# Patient Record
Sex: Female | Born: 1935
Health system: Southern US, Community
[De-identification: ages and names within clinical notes are randomized; demographics above are authoritative.]

## PROBLEM LIST (undated history)

## (undated) DIAGNOSIS — G8929 Other chronic pain: Secondary | ICD-10-CM

## (undated) DIAGNOSIS — M1990 Unspecified osteoarthritis, unspecified site: Secondary | ICD-10-CM

## (undated) DIAGNOSIS — E079 Disorder of thyroid, unspecified: Secondary | ICD-10-CM

## (undated) HISTORY — PX: CHOLECYSTECTOMY: SHX55

## (undated) HISTORY — PX: CERVICAL FUSION: SHX112

## (undated) HISTORY — PX: ABDOMINAL HYSTERECTOMY: SHX81

---

## 1997-08-07 ENCOUNTER — Encounter: Admission: RE | Admit: 1997-08-07 | Discharge: 1997-11-05 | Payer: Self-pay | Admitting: Family Medicine

## 1998-01-23 ENCOUNTER — Ambulatory Visit (HOSPITAL_COMMUNITY): Admission: RE | Admit: 1998-01-23 | Discharge: 1998-01-24 | Payer: Self-pay | Admitting: Surgery

## 1998-01-23 ENCOUNTER — Encounter: Payer: Self-pay | Admitting: Surgery

## 1998-10-20 ENCOUNTER — Other Ambulatory Visit: Admission: RE | Admit: 1998-10-20 | Discharge: 1998-10-20 | Payer: Self-pay | Admitting: Plastic Surgery

## 1998-10-20 ENCOUNTER — Encounter (INDEPENDENT_AMBULATORY_CARE_PROVIDER_SITE_OTHER): Payer: Self-pay

## 1998-10-31 ENCOUNTER — Ambulatory Visit (HOSPITAL_COMMUNITY): Admission: RE | Admit: 1998-10-31 | Discharge: 1998-11-02 | Payer: Self-pay | Admitting: Plastic Surgery

## 1998-10-31 ENCOUNTER — Encounter: Payer: Self-pay | Admitting: Plastic Surgery

## 2000-01-26 ENCOUNTER — Encounter: Admission: RE | Admit: 2000-01-26 | Discharge: 2000-04-25 | Payer: Self-pay | Admitting: Anesthesiology

## 2000-01-27 ENCOUNTER — Encounter: Admission: RE | Admit: 2000-01-27 | Discharge: 2000-04-26 | Payer: Self-pay | Admitting: Anesthesiology

## 2000-04-18 ENCOUNTER — Ambulatory Visit (HOSPITAL_BASED_OUTPATIENT_CLINIC_OR_DEPARTMENT_OTHER): Admission: RE | Admit: 2000-04-18 | Discharge: 2000-04-18 | Payer: Self-pay | Admitting: Orthopedic Surgery

## 2000-05-05 ENCOUNTER — Encounter: Admission: RE | Admit: 2000-05-05 | Discharge: 2000-08-03 | Payer: Self-pay | Admitting: Anesthesiology

## 2000-05-23 ENCOUNTER — Encounter: Payer: Self-pay | Admitting: Anesthesiology

## 2000-08-15 ENCOUNTER — Encounter: Payer: Self-pay | Admitting: Family Medicine

## 2000-08-15 ENCOUNTER — Encounter: Admission: RE | Admit: 2000-08-15 | Discharge: 2000-08-15 | Payer: Self-pay | Admitting: Family Medicine

## 2000-08-31 ENCOUNTER — Encounter: Admission: RE | Admit: 2000-08-31 | Discharge: 2000-09-21 | Payer: Self-pay | Admitting: Anesthesiology

## 2003-02-25 ENCOUNTER — Emergency Department (HOSPITAL_COMMUNITY): Admission: EM | Admit: 2003-02-25 | Discharge: 2003-02-25 | Payer: Self-pay | Admitting: Emergency Medicine

## 2003-12-23 ENCOUNTER — Ambulatory Visit (HOSPITAL_COMMUNITY): Admission: RE | Admit: 2003-12-23 | Discharge: 2003-12-23 | Payer: Self-pay | Admitting: Family Medicine

## 2004-01-03 ENCOUNTER — Ambulatory Visit (HOSPITAL_COMMUNITY): Admission: RE | Admit: 2004-01-03 | Discharge: 2004-01-03 | Payer: Self-pay | Admitting: Gastroenterology

## 2005-04-07 ENCOUNTER — Ambulatory Visit (HOSPITAL_COMMUNITY): Admission: RE | Admit: 2005-04-07 | Discharge: 2005-04-07 | Payer: Self-pay | Admitting: Family Medicine

## 2009-11-04 ENCOUNTER — Encounter: Admission: RE | Admit: 2009-11-04 | Discharge: 2009-12-17 | Payer: Self-pay | Admitting: Orthopedic Surgery

## 2010-07-10 NOTE — H&P (Signed)
[facility]  Patient:    Lauren Herrera, Lauren Herrera                        MRN: [id number] Adm. Date:  [id number] Attending:  Thyra Breed CC:         Dario Guardian, M.D.   History and Physical  Beva comes in for followup evaluation of her chronic low back pain and neck discomfort.  Since her last evaluation, her lower back is bothering her minimally, and she has some neck discomfort localized to the neck.  She feels much better on the Duragesic patches than she did on the MS Contin, but has noted that her pain level is up to about 7/10.  Her arms are not bothering her.  She has minimal additional complaints from her last visit.  She does note that lifting and sweeping will increase her discomfort.  She continues on her Synthroid, Evista, Diozide, Verapamil, Elavil, and Phentanyl.  PHYSICAL EXAMINATION:  VITAL SIGNS:  Blood pressure 142/75, heart rate 85, respiratory rate 15, O2 saturation 99%, pain level 7/10.  EXTREMITIES:  Deep tendon reflexes were symmetric in the upper and lower extremities with motor intact.  Her gait is intact.  IMPRESSION: 1. Neck pain on the basis of cervical spondylosis, improved. 2. Low back pain on the basis of lumbar spondylosis with arachnoiditis,    stable. 3. Other medical problems per Dr. Merri Brunette.  DISPOSITION: 1. Duragesic 75 mcg one applied every 2 days #15. 2. Continue on Elavil. 3. Follow up with me in 8 weeks. DD:  09/01/00 TD:  09/01/00 Job: [id number]

## 2010-07-10 NOTE — Op Note (Signed)
NAME:  BEONKA, AMESQUITA                 ACCOUNT NO.:  [id number]   MEDICAL RECORD NO.:  [id number]          PATIENT TYPE:  AMB   LOCATION:  ENDO                         FACILITY:  [facility]   PHYSICIAN:  Graylin Shiver, M.D.   DATE OF BIRTH:  Sep 07, 1935   DATE OF PROCEDURE:  01/03/2004  DATE OF DISCHARGE:                                 OPERATIVE REPORT   PROCEDURE:  Colonoscopy.   INDICATIONS:  Family history of colon cancer.   Informed consent was obtained after explanation of the risks of bleeding,  infection, and perforation.   PREMEDICATION:  Fentanyl 100 mcg IV, Versed 7.5 mg IV.   PROCEDURE:  With the patient in the left lateral decubitus position, a  rectal exam was performed, no masses were felt.  The Olympus colonoscope was  inserted into the rectum and advanced around an extremely tortuous colon to  the cecum.  The patient had to be placed on her back and even on her right  side to achieve cecal intubation.  The cecum and ascending colon looked  normal.  The transverse colon looked normal.  The descending colon and  sigmoid revealed diverticulosis.  These were quite extensive in the sigmoid  region.  The rectum looked normal.  She tolerated the procedure well without  complications.   IMPRESSION:  Diverticulosis.       SFG/MEDQ  D:  01/03/2004  T:  01/04/2004  Job:  [id number]   cc:   Holley Bouche, M.D.  [address]  [place], [place] [address]  Fax: [phone]

## 2010-12-31 ENCOUNTER — Other Ambulatory Visit: Payer: Self-pay | Admitting: Rheumatology

## 2010-12-31 ENCOUNTER — Ambulatory Visit
Admission: RE | Admit: 2010-12-31 | Discharge: 2010-12-31 | Disposition: A | Discharge status: Home / Self Care | Payer: Medicare Other | Source: Ambulatory Visit | Attending: Rheumatology | Admitting: Rheumatology

## 2010-12-31 DIAGNOSIS — M069 Rheumatoid arthritis, unspecified: Secondary | ICD-10-CM

## 2010-12-31 DIAGNOSIS — M542 Cervicalgia: Secondary | ICD-10-CM

## 2011-04-13 ENCOUNTER — Emergency Department (INDEPENDENT_AMBULATORY_CARE_PROVIDER_SITE_OTHER): Payer: Medicare Other

## 2011-04-13 ENCOUNTER — Encounter (HOSPITAL_BASED_OUTPATIENT_CLINIC_OR_DEPARTMENT_OTHER): Payer: Self-pay | Admitting: *Deleted

## 2011-04-13 ENCOUNTER — Other Ambulatory Visit: Payer: Self-pay

## 2011-04-13 ENCOUNTER — Emergency Department (HOSPITAL_BASED_OUTPATIENT_CLINIC_OR_DEPARTMENT_OTHER)
Admission: EM | Admit: 2011-04-13 | Discharge: 2011-04-13 | Disposition: A | Discharge status: Home / Self Care | Payer: Medicare Other | Attending: Emergency Medicine | Admitting: Emergency Medicine

## 2011-04-13 DIAGNOSIS — M79609 Pain in unspecified limb: Secondary | ICD-10-CM

## 2011-04-13 DIAGNOSIS — M11249 Other chondrocalcinosis, unspecified hand: Secondary | ICD-10-CM

## 2011-04-13 DIAGNOSIS — Z8739 Personal history of other diseases of the musculoskeletal system and connective tissue: Secondary | ICD-10-CM | POA: Insufficient documentation

## 2011-04-13 DIAGNOSIS — E079 Disorder of thyroid, unspecified: Secondary | ICD-10-CM | POA: Insufficient documentation

## 2011-04-13 DIAGNOSIS — M25529 Pain in unspecified elbow: Secondary | ICD-10-CM

## 2011-04-13 DIAGNOSIS — G8929 Other chronic pain: Secondary | ICD-10-CM | POA: Insufficient documentation

## 2011-04-13 HISTORY — DX: Other chronic pain: G89.29

## 2011-04-13 HISTORY — DX: Unspecified osteoarthritis, unspecified site: M19.90

## 2011-04-13 HISTORY — DX: Disorder of thyroid, unspecified: E07.9

## 2011-04-13 LAB — CBC
MCH: 29.4 pg (ref 26.0–34.0)
MCHC: 33.2 g/dL (ref 30.0–36.0)
Platelets: 192 10*3/uL (ref 150–400)

## 2011-04-13 LAB — CARDIAC PANEL(CRET KIN+CKTOT+MB+TROPI): Troponin I: <0.3 ng/mL (ref <0.30)

## 2011-04-13 LAB — DIFFERENTIAL
Basophils Relative: 0 % (ref 0–1)
Eosinophils Absolute: 0.2 10*3/uL (ref 0.0–0.7)
Eosinophils Relative: 3 % (ref 0–5)
Monocytes Relative: 10 % (ref 3–12)
Neutrophils Relative %: 72 % (ref 43–77)

## 2011-04-13 LAB — BASIC METABOLIC PANEL
BUN: 9 mg/dL (ref 6–23)
Calcium: 9.7 mg/dL (ref 8.4–10.5)
GFR calc Af Amer: >90 mL/min (ref >90)
GFR calc non Af Amer: 84 mL/min — ABNORMAL LOW (ref >90)
Potassium: 3.5 mEq/L (ref 3.5–5.1)
Sodium: 139 mEq/L (ref 135–145)

## 2011-04-13 MED ORDER — IBUPROFEN 600 MG PO TABS: 600.0000 mg | ORAL_TABLET | Freq: Four times a day (QID) | ORAL | Status: AC | PRN

## 2011-04-13 MED ORDER — KETOROLAC TROMETHAMINE 60 MG/2ML IM SOLN
60.0000 mg | Freq: Once | INTRAMUSCULAR | Status: AC
  Administered 2011-04-13: 60 mg via INTRAMUSCULAR
  Filled 2011-04-13: qty 2

## 2011-04-13 MED ORDER — OXYCODONE-ACETAMINOPHEN 5-325 MG PO TABS
1.0000 | ORAL_TABLET | Freq: Once | ORAL | Status: AC
  Administered 2011-04-13: 1 via ORAL
  Filled 2011-04-13: qty 1

## 2011-04-13 NOTE — ED Provider Notes (Signed)
History     CSN: [id number]  Arrival date & time 04/13/11  4098   First MD Initiated Contact with Patient 04/13/11 [phone]      Chief Complaint  Patient presents with  . Arm Pain    (Consider location/radiation/quality/duration/timing/severity/associated sxs/prior treatment) Patient is a 76 y.o. female presenting with arm pain. The history is provided by the patient. No language interpreter was used.  Arm Pain This is a new problem. The current episode started 3 to 5 hours ago. The problem occurs constantly. The problem has not changed since onset.Pertinent negatives include no chest pain, no abdominal pain, no headaches and no shortness of breath. The symptoms are aggravated by nothing. The symptoms are relieved by nothing. Treatments tried: narcotics. The treatment provided no relief.  Denies f/c/r.  No CP, SOB, no DOE, No n/v/d.  No neck pain.  No stiffness.  No weakness or numbness. Denies trauma.    Past Medical History  Diagnosis Date  . Arthritis   . Osteoarthritis   . Thyroid disease   . Chronic pain     Past Surgical History  Procedure Date  . Abdominal hysterectomy   . Cervical fusion   . Cholecystectomy     History reviewed. No pertinent family history.  History  Substance Use Topics  . Smoking status: Never Smoker   . Smokeless tobacco: Not on file  . Alcohol Use: No    OB History    Grav Para Term Preterm Abortions TAB SAB Ect Mult Living                  Review of Systems  Constitutional: Negative.  Negative for fever.  HENT: Negative.   Eyes: Negative.   Respiratory: Negative for chest tightness, shortness of breath and wheezing.   Cardiovascular: Negative for chest pain, palpitations and leg swelling.  Gastrointestinal: Negative for nausea, vomiting, abdominal pain and abdominal distention.  Genitourinary: Negative.   Musculoskeletal: Negative for back pain and joint swelling.  Skin: Negative for wound.  Neurological: Negative for headaches.    Hematological: Negative.   Psychiatric/Behavioral: Negative.     Allergies  Codeine and Latex  Home Medications   Current Outpatient Rx  Name Route Sig Dispense Refill  . ATORVASTATIN CALCIUM 10 MG PO TABS Oral Take 10 mg by mouth daily.    Marland Kitchen METHOTREXATE SODIUM 10 MG PO TABS Oral Take 10 mg by mouth once a week. Caution: Chemotherapy. Protect from light.    . MORPHINE SULFATE 30 MG PO TABS Oral Take 30 mg by mouth every 4 (four) hours as needed.      BP 155/82  Pulse 75  Temp(Src) 98.2 F (36.8 C) (Oral)  Resp 18  Ht 5\' 10"  (1.778 m)  Wt 147 lb (66.679 kg)  BMI 21.09 kg/m2  SpO2 98%  Physical Exam  Constitutional: She is oriented to person, place, and time. She appears well-developed and well-nourished.  HENT:  Head: Normocephalic.  Mouth/Throat: Oropharynx is clear and moist.  Eyes: Conjunctivae are normal. Pupils are equal, round, and reactive to light.  Neck: Normal range of motion. Neck supple. No tracheal deviation present.  Cardiovascular: Normal rate and regular rhythm.   Pulmonary/Chest: Effort normal and breath sounds normal.  Abdominal: Soft. Bowel sounds are normal. There is no tenderness.  Musculoskeletal: She exhibits tenderness. She exhibits no edema.       Right shoulder: Normal. She exhibits normal range of motion and no deformity.       Left shoulder: She  exhibits normal range of motion, no tenderness, no swelling, no effusion, no crepitus, no deformity and normal strength.       Left elbow: She exhibits normal range of motion, no swelling and no effusion. no tenderness found.       Left wrist: She exhibits normal range of motion, no tenderness, no swelling, no effusion, no crepitus, no deformity and no laceration.       Left hand: She exhibits normal range of motion and normal capillary refill. normal sensation noted. Normal strength noted.       Pain on palpation of the entire LUE.  5/5 strength intact sensation.  2+ radial pulse.  FROM.  Cap refill to  the fingers < 2 sec no snuff box tenderness.  Most tender on dorsum of left hand.  No erythema warmth no induration.  Negative NEER test of LUE  Lymphadenopathy:    She has no cervical adenopathy.  Neurological: She is alert and oriented to person, place, and time. She has normal reflexes. She displays normal reflexes.  Skin: Skin is warm and dry. She is not diaphoretic.  Psychiatric: She has a normal mood and affect.    ED Course  Procedures (including critical care time)   Labs Reviewed  CBC  DIFFERENTIAL  BASIC METABOLIC PANEL  CARDIAC PANEL(CRET KIN+CKTOT+MB+TROPI)   No results found.   No diagnosis found.    MDM   Date: 04/13/2011  Rate: 65  Rhythm: normal sinus rhythm  QRS Axis: normal  Intervals: normal  ST/T Wave abnormalities: normal  Conduction Disutrbances:none  Narrative Interpretation:   Old EKG Reviewed: unchanged    Signed out to Dr. Patria Mane pending CT scan     Kostas Marrow Smitty Cords, MD 04/13/11 [phone]

## 2011-04-13 NOTE — Discharge Instructions (Signed)
Arthralgia Your caregiver has diagnosed you as suffering from an arthralgia. Arthralgia means there is pain in a joint. This can come from many reasons including:  Bruising the joint which causes soreness (inflammation) in the joint.   Wear and tear on the joints which occur as we grow older (osteoarthritis).   Overusing the joint.   Various forms of arthritis.   Infections of the joint.  Regardless of the cause of pain in your joint, most of these different pains respond to anti-inflammatory drugs and rest. The exception to this is when a joint is infected, and these cases are treated with antibiotics, if it is a bacterial infection. HOME CARE INSTRUCTIONS   Rest the injured area for as long as directed by your caregiver. Then slowly start using the joint as directed by your caregiver and as the pain allows. Crutches as directed may be useful if the ankles, knees or hips are involved. If the knee was splinted or casted, continue use and care as directed. If an stretchy or elastic wrapping bandage has been applied today, it should be removed and re-applied every 3 to 4 hours. It should not be applied tightly, but firmly enough to keep swelling down. Watch toes and feet for swelling, bluish discoloration, coldness, numbness or excessive pain. If any of these problems (symptoms) occur, remove the ace bandage and re-apply more loosely. If these symptoms persist, contact your caregiver or return to this location.   For the first 24 hours, keep the injured extremity elevated on pillows while lying down.   Apply ice for 15 to 20 minutes to the sore joint every couple hours while awake for the first half day. Then 3 to 4 times per day for the first 48 hours. Put the ice in a plastic bag and place a towel between the bag of ice and your skin.   Wear any splinting, casting, elastic bandage applications, or slings as instructed.   Only take over-the-counter or prescription medicines for pain,  discomfort, or fever as directed by your caregiver. Do not use aspirin immediately after the injury unless instructed by your physician. Aspirin can cause increased bleeding and bruising of the tissues.   If you were given crutches, continue to use them as instructed and do not resume weight bearing on the sore joint until instructed.  Persistent pain and inability to use the sore joint as directed for more than 2 to 3 days are warning signs indicating that you should see a caregiver for a follow-up visit as soon as possible. Initially, a hairline fracture (break in bone) may not be evident on X-rays. Persistent pain and swelling indicate that further evaluation, non-weight bearing or use of the joint (use of crutches or slings as instructed), or further X-rays are indicated. X-rays may sometimes not show a small fracture until a week or 10 days later. Make a follow-up appointment with your own caregiver or one to whom we have referred you. A radiologist (specialist in reading X-rays) may read your X-rays. Make sure you know how you are to obtain your X-ray results. Do not assume everything is normal if you do not hear from us. SEEK MEDICAL CARE IF: Bruising, swelling, or pain increases. SEEK IMMEDIATE MEDICAL CARE IF:   Your fingers or toes are numb or blue.   The pain is not responding to medications and continues to stay the same or get worse.   The pain in your joint becomes severe.   You develop a fever over   102 F (38.9 C).   It becomes impossible to move or use the joint.  MAKE SURE YOU:   Understand these instructions.   Will watch your condition.   Will get help right away if you are not doing well or get worse.  Document Released: 02/08/2005 Document Revised: 10/21/2010 Document Reviewed: 09/27/2007 [employer] Patient Information 2012 [employer]. 

## 2011-04-13 NOTE — ED Notes (Signed)
C/o left arm pain since this AM, denies injury. No CP, no SOB.

## 2011-04-13 NOTE — ED Notes (Signed)
MD at bedside. 

## 2011-04-13 NOTE — ED Notes (Signed)
Triage acuity adjusted by this rn to reflect plan of care, pt not assessed by this rn.

## 2011-04-13 NOTE — ED Notes (Signed)
Patient transported to X-ray 

## 2011-04-13 NOTE — ED Notes (Signed)
+  PMS to left arm, limited movement of extremity.

## 2011-06-11 ENCOUNTER — Other Ambulatory Visit: Payer: Self-pay | Admitting: Gastroenterology

## 2014-08-15 ENCOUNTER — Emergency Department (HOSPITAL_COMMUNITY): Payer: PPO

## 2014-08-15 ENCOUNTER — Encounter (HOSPITAL_COMMUNITY): Payer: Self-pay | Admitting: Internal Medicine

## 2014-08-15 ENCOUNTER — Inpatient Hospital Stay (HOSPITAL_COMMUNITY)
Admission: EM | Admit: 2014-08-15 | Discharge: 2014-08-17 | DRG: 312 | Disposition: A | Discharge status: Home / Self Care | Payer: PPO | Attending: Internal Medicine | Admitting: Internal Medicine

## 2014-08-15 DIAGNOSIS — E876 Hypokalemia: Secondary | ICD-10-CM | POA: Diagnosis not present

## 2014-08-15 DIAGNOSIS — Z91040 Latex allergy status: Secondary | ICD-10-CM

## 2014-08-15 DIAGNOSIS — R531 Weakness: Secondary | ICD-10-CM

## 2014-08-15 DIAGNOSIS — R7989 Other specified abnormal findings of blood chemistry: Secondary | ICD-10-CM

## 2014-08-15 DIAGNOSIS — M1990 Unspecified osteoarthritis, unspecified site: Secondary | ICD-10-CM | POA: Diagnosis present

## 2014-08-15 DIAGNOSIS — Z8739 Personal history of other diseases of the musculoskeletal system and connective tissue: Secondary | ICD-10-CM

## 2014-08-15 DIAGNOSIS — Z79899 Other long term (current) drug therapy: Secondary | ICD-10-CM

## 2014-08-15 DIAGNOSIS — N390 Urinary tract infection, site not specified: Secondary | ICD-10-CM | POA: Diagnosis not present

## 2014-08-15 DIAGNOSIS — E785 Hyperlipidemia, unspecified: Secondary | ICD-10-CM | POA: Diagnosis present

## 2014-08-15 DIAGNOSIS — E039 Hypothyroidism, unspecified: Secondary | ICD-10-CM | POA: Diagnosis present

## 2014-08-15 DIAGNOSIS — E869 Volume depletion, unspecified: Secondary | ICD-10-CM | POA: Diagnosis present

## 2014-08-15 DIAGNOSIS — Z8249 Family history of ischemic heart disease and other diseases of the circulatory system: Secondary | ICD-10-CM

## 2014-08-15 DIAGNOSIS — G894 Chronic pain syndrome: Secondary | ICD-10-CM | POA: Diagnosis present

## 2014-08-15 DIAGNOSIS — R197 Diarrhea, unspecified: Secondary | ICD-10-CM | POA: Diagnosis present

## 2014-08-15 DIAGNOSIS — R55 Syncope and collapse: Secondary | ICD-10-CM | POA: Diagnosis not present

## 2014-08-15 DIAGNOSIS — M069 Rheumatoid arthritis, unspecified: Secondary | ICD-10-CM | POA: Diagnosis present

## 2014-08-15 DIAGNOSIS — R748 Abnormal levels of other serum enzymes: Secondary | ICD-10-CM | POA: Diagnosis present

## 2014-08-15 DIAGNOSIS — I951 Orthostatic hypotension: Secondary | ICD-10-CM | POA: Diagnosis not present

## 2014-08-15 DIAGNOSIS — Z79891 Long term (current) use of opiate analgesic: Secondary | ICD-10-CM

## 2014-08-15 DIAGNOSIS — Z885 Allergy status to narcotic agent status: Secondary | ICD-10-CM

## 2014-08-15 DIAGNOSIS — R778 Other specified abnormalities of plasma proteins: Secondary | ICD-10-CM | POA: Diagnosis not present

## 2014-08-15 LAB — I-STAT TROPONIN, ED: Troponin i, poc: 0.08 ng/mL (ref 0.00–0.08)

## 2014-08-15 LAB — URINALYSIS, ROUTINE W REFLEX MICROSCOPIC
Bilirubin Urine: NEGATIVE
Glucose, UA: NEGATIVE mg/dL
Ketones, ur: NEGATIVE mg/dL
Nitrite: NEGATIVE
PH: 6.5 (ref 5.0–8.0)
Protein, ur: 100 mg/dL — AB
SPECIFIC GRAVITY, URINE: 1.01 (ref 1.005–1.030)
Urobilinogen, UA: 0.2 mg/dL (ref 0.0–1.0)

## 2014-08-15 LAB — I-STAT CG4 LACTIC ACID, ED: Lactic Acid, Venous: 1.73 mmol/L (ref 0.5–2.0)

## 2014-08-15 LAB — CBC WITH DIFFERENTIAL/PLATELET
Basophils Absolute: 0 10*3/uL (ref 0.0–0.1)
Basophils Relative: 0 % (ref 0–1)
EOS PCT: 0 % (ref 0–5)
Eosinophils Absolute: 0 10*3/uL (ref 0.0–0.7)
HEMATOCRIT: 41.9 % (ref 36.0–46.0)
Hemoglobin: 14.6 g/dL (ref 12.0–15.0)
LYMPHS ABS: 1.3 10*3/uL (ref 0.7–4.0)
LYMPHS PCT: 9 % — AB (ref 12–46)
MCH: 30.4 pg (ref 26.0–34.0)
MCHC: 34.8 g/dL (ref 30.0–36.0)
MCV: 87.1 fL (ref 78.0–100.0)
MONO ABS: 0.4 10*3/uL (ref 0.1–1.0)
MONOS PCT: 3 % (ref 3–12)
Neutro Abs: 12.3 10*3/uL — ABNORMAL HIGH (ref 1.7–7.7)
Neutrophils Relative %: 88 % — ABNORMAL HIGH (ref 43–77)
PLATELETS: 211 10*3/uL (ref 150–400)
RBC: 4.81 MIL/uL (ref 3.87–5.11)
RDW: 14.4 % (ref 11.5–15.5)
WBC: 14 10*3/uL — AB (ref 4.0–10.5)

## 2014-08-15 LAB — BASIC METABOLIC PANEL
ANION GAP: 9 (ref 5–15)
BUN: 14 mg/dL (ref 6–20)
CHLORIDE: 101 mmol/L (ref 101–111)
CO2: 29 mmol/L (ref 22–32)
CREATININE: 0.87 mg/dL (ref 0.44–1.00)
Calcium: 8.8 mg/dL — ABNORMAL LOW (ref 8.9–10.3)
GFR calc Af Amer: >60 mL/min (ref >60)
GFR calc non Af Amer: >60 mL/min (ref >60)
GLUCOSE: 112 mg/dL — AB (ref 65–99)
POTASSIUM: 3.2 mmol/L — AB (ref 3.5–5.1)
Sodium: 139 mmol/L (ref 135–145)

## 2014-08-15 LAB — COMPREHENSIVE METABOLIC PANEL
ALK PHOS: 132 U/L — AB (ref 38–126)
ALT: 32 U/L (ref 14–54)
ANION GAP: 13 (ref 5–15)
AST: 37 U/L (ref 15–41)
Albumin: 3.2 g/dL — ABNORMAL LOW (ref 3.5–5.0)
BUN: 14 mg/dL (ref 6–20)
CO2: 26 mmol/L (ref 22–32)
Calcium: 9.1 mg/dL (ref 8.9–10.3)
Chloride: 99 mmol/L — ABNORMAL LOW (ref 101–111)
Creatinine, Ser: 0.95 mg/dL (ref 0.44–1.00)
GFR calc non Af Amer: 57 mL/min — ABNORMAL LOW (ref >60)
GLUCOSE: 116 mg/dL — AB (ref 65–99)
POTASSIUM: 2.8 mmol/L — AB (ref 3.5–5.1)
Sodium: 138 mmol/L (ref 135–145)
Total Bilirubin: 1.8 mg/dL — ABNORMAL HIGH (ref 0.3–1.2)
Total Protein: 6.7 g/dL (ref 6.5–8.1)

## 2014-08-15 LAB — URINE MICROSCOPIC-ADD ON

## 2014-08-15 LAB — CBC
HEMATOCRIT: 40.8 % (ref 36.0–46.0)
Hemoglobin: 14 g/dL (ref 12.0–15.0)
MCH: 30.4 pg (ref 26.0–34.0)
MCHC: 34.3 g/dL (ref 30.0–36.0)
MCV: 88.5 fL (ref 78.0–100.0)
Platelets: 192 10*3/uL (ref 150–400)
RBC: 4.61 MIL/uL (ref 3.87–5.11)
RDW: 14.6 % (ref 11.5–15.5)
WBC: 12.4 10*3/uL — AB (ref 4.0–10.5)

## 2014-08-15 LAB — CG4 I-STAT (LACTIC ACID): Lactic Acid, Venous: 1.02 mmol/L (ref 0.5–2.0)

## 2014-08-15 LAB — LIPASE, BLOOD: LIPASE: 35 U/L (ref 22–51)

## 2014-08-15 LAB — MAGNESIUM: Magnesium: 2.1 mg/dL (ref 1.7–2.4)

## 2014-08-15 LAB — CK: CK TOTAL: 78 U/L (ref 38–234)

## 2014-08-15 MED ORDER — SACCHAROMYCES BOULARDII 250 MG PO CAPS
250.0000 mg | ORAL_CAPSULE | Freq: Two times a day (BID) | ORAL | Status: DC
  Administered 2014-08-15 – 2014-08-16 (×3): 250 mg via ORAL
  Filled 2014-08-15 (×5): qty 1

## 2014-08-15 MED ORDER — POTASSIUM CHLORIDE CRYS ER 20 MEQ PO TBCR: 40.0000 meq | EXTENDED_RELEASE_TABLET | Freq: Once | ORAL | Status: DC

## 2014-08-15 MED ORDER — ACETAMINOPHEN 325 MG PO TABS: 650.0000 mg | ORAL_TABLET | Freq: Four times a day (QID) | ORAL | Status: DC | PRN

## 2014-08-15 MED ORDER — GABAPENTIN 300 MG PO CAPS
300.0000 mg | ORAL_CAPSULE | Freq: Two times a day (BID) | ORAL | Status: DC
  Administered 2014-08-15 – 2014-08-16 (×3): 300 mg via ORAL
  Filled 2014-08-15 (×5): qty 1

## 2014-08-15 MED ORDER — ONDANSETRON HCL 4 MG PO TABS: 4.0000 mg | ORAL_TABLET | Freq: Four times a day (QID) | ORAL | Status: DC | PRN

## 2014-08-15 MED ORDER — DEXTROSE 5 % IV SOLN
1.0000 g | Freq: Once | INTRAVENOUS | Status: AC
  Administered 2014-08-15: 1 g via INTRAVENOUS
  Filled 2014-08-15: qty 10

## 2014-08-15 MED ORDER — MORPHINE SULFATE ER 30 MG PO TBCR
60.0000 mg | EXTENDED_RELEASE_TABLET | Freq: Two times a day (BID) | ORAL | Status: DC
  Administered 2014-08-15 – 2014-08-16 (×3): 60 mg via ORAL
  Filled 2014-08-15 (×3): qty 2

## 2014-08-15 MED ORDER — ACETAMINOPHEN 650 MG RE SUPP: 650.0000 mg | Freq: Four times a day (QID) | RECTAL | Status: DC | PRN

## 2014-08-15 MED ORDER — HYDROMORPHONE HCL 1 MG/ML IJ SOLN
1.0000 mg | Freq: Once | INTRAMUSCULAR | Status: AC
  Administered 2014-08-15: 1 mg via INTRAVENOUS
  Filled 2014-08-15: qty 1

## 2014-08-15 MED ORDER — LEVOTHYROXINE SODIUM 88 MCG PO TABS
88.0000 ug | ORAL_TABLET | Freq: Every day | ORAL | Status: DC
  Administered 2014-08-16 – 2014-08-17 (×2): 88 ug via ORAL
  Filled 2014-08-15 (×3): qty 1

## 2014-08-15 MED ORDER — HYDROMORPHONE HCL 1 MG/ML IJ SOLN: 1.0000 mg | INTRAMUSCULAR | Status: DC | PRN

## 2014-08-15 MED ORDER — ATORVASTATIN CALCIUM 10 MG PO TABS
10.0000 mg | ORAL_TABLET | Freq: Every day | ORAL | Status: DC
  Administered 2014-08-16: 10 mg via ORAL
  Filled 2014-08-15 (×2): qty 1

## 2014-08-15 MED ORDER — SODIUM CHLORIDE 0.9 % IV BOLUS (SEPSIS)
1000.0000 mL | Freq: Once | INTRAVENOUS | Status: AC
  Administered 2014-08-15: 1000 mL via INTRAVENOUS

## 2014-08-15 MED ORDER — POTASSIUM CHLORIDE IN NACL 20-0.9 MEQ/L-% IV SOLN
INTRAVENOUS | Status: AC
  Administered 2014-08-15: 23:00:00 via INTRAVENOUS
  Filled 2014-08-15 (×3): qty 1000

## 2014-08-15 MED ORDER — FOLIC ACID 1 MG PO TABS
1000.0000 ug | ORAL_TABLET | Freq: Every day | ORAL | Status: DC
  Administered 2014-08-16: 1 mg via ORAL
  Filled 2014-08-15 (×2): qty 1

## 2014-08-15 MED ORDER — POTASSIUM CHLORIDE 10 MEQ/100ML IV SOLN: 10.0000 meq | INTRAVENOUS | Status: DC

## 2014-08-15 MED ORDER — METHOTREXATE 2.5 MG PO TABS: 10.0000 mg | ORAL_TABLET | ORAL | Status: DC

## 2014-08-15 MED ORDER — POTASSIUM CHLORIDE CRYS ER 20 MEQ PO TBCR
40.0000 meq | EXTENDED_RELEASE_TABLET | Freq: Once | ORAL | Status: AC
  Administered 2014-08-15: 40 meq via ORAL
  Filled 2014-08-15: qty 2

## 2014-08-15 MED ORDER — ONDANSETRON HCL 4 MG/2ML IJ SOLN: 4.0000 mg | Freq: Four times a day (QID) | INTRAMUSCULAR | Status: DC | PRN

## 2014-08-15 MED ORDER — ENOXAPARIN SODIUM 40 MG/0.4ML ~~LOC~~ SOLN
40.0000 mg | Freq: Every day | SUBCUTANEOUS | Status: DC
  Administered 2014-08-15 – 2014-08-16 (×2): 40 mg via SUBCUTANEOUS
  Filled 2014-08-15 (×3): qty 0.4

## 2014-08-15 MED ORDER — CEFTRIAXONE SODIUM 1 G IJ SOLR
1.0000 g | INTRAMUSCULAR | Status: DC
  Administered 2014-08-16: 1 g via INTRAVENOUS
  Filled 2014-08-15: qty 10

## 2014-08-15 MED ORDER — PROMETHAZINE HCL 25 MG/ML IJ SOLN
12.5000 mg | Freq: Once | INTRAMUSCULAR | Status: AC
  Administered 2014-08-15: 12.5 mg via INTRAVENOUS
  Filled 2014-08-15: qty 1

## 2014-08-15 MED ORDER — MORPHINE SULFATE ER 60 MG PO CP24: 60.0000 mg | ORAL_CAPSULE | Freq: Two times a day (BID) | ORAL | Status: DC

## 2014-08-15 NOTE — ED Notes (Signed)
Patient transported to CT 

## 2014-08-15 NOTE — H&P (Signed)
[facility] History and Physical  Lauren Herrera OEU:[id number] DOB: January 08, 1936 DOA: 08/15/2014  Referring physician: Alinda Sierras. PCP: Reginia Naas, MD  Specialists: None.  Chief Complaint: Loss of consciousness and generalized body ache.  HPI: Lauren Herrera is a 79 y.o. female with history of rheumatoid arthritis on methotrexate, hyperlipidemia, hypothyroidism and chronic pain was brought to the ER after patient was complaining of generalized body ache and had an episode of loss of consciousness 4 days ago. As per the patient's daughter 4 days ago patient was found on the floor near her bed unconscious in the morning. When patient's daughter workup the patient patient was immediately responsive. Patient does not remember how she loss consciousness. Patient did have bowel incontinence then. Since the fall patient has been having generalized body ache and has had recurrent episodes of diarrhea. Patient denies taking any recent antibiotics or recent admissions. In the ER patient was mildly orthostatic. Labs revealed hypokalemia. CT head did not show anything acute. EKG was showing normal sinus rhythm with nonspecific ST-T changes and no prolonged QTC. Patient has been admitted for further workup of syncope diarrhea and UTI.   Review of Systems: As presented in the history of presenting illness, rest negative.  Past Medical History  Diagnosis Date  . Arthritis   . Osteoarthritis   . Thyroid disease   . Chronic pain    Past Surgical History  Procedure Laterality Date  . Abdominal hysterectomy    . Cervical fusion    . Cholecystectomy     Social History:  reports that she has never smoked. She does not have any smokeless tobacco history on file. She reports that she does not drink alcohol or use illicit drugs. Where does patient live home. Can patient participate in ADLs? Yes.  Allergies  Allergen Reactions  . Codeine Nausea And Vomiting  . Latex Rash    Family  History:  Family History  Problem Relation Age of Onset  . CAD Neg Hx   . Stroke Neg Hx       Prior to Admission medications   Medication Sig Start Date End Date Taking? Authorizing Provider  atorvastatin (LIPITOR) 10 MG tablet Take 10 mg by mouth daily.   Yes Historical Provider, MD  folic acid (FOLVITE) 154 MCG tablet Take 400 mcg by mouth daily.   Yes Historical Provider, MD  gabapentin (NEURONTIN) 100 MG capsule Take 300 mg by mouth 2 (two) times daily.   Yes Historical Provider, MD  levothyroxine (SYNTHROID, LEVOTHROID) 88 MCG tablet Take 88 mcg by mouth daily.   Yes Historical Provider, MD  methotrexate (RHEUMATREX) 10 MG tablet Take 10 mg by mouth once a week. Caution: Chemotherapy. Protect from light.   Yes Historical Provider, MD  morphine (KADIAN) 60 MG 24 hr capsule Take 60 mg by mouth 2 (two) times daily.   Yes Historical Provider, MD    Physical Exam: Filed Vitals:   08/15/14 1747 08/15/14 1810 08/15/14 2032 08/15/14 2118  BP:  145/84 136/80 161/75  Pulse:  84 76 73  Temp:  98.7 F (37.1 C)  98.4 F (36.9 C)  TempSrc:  Oral  Oral  Resp:  17 16 18   Height: 5\' 9"  (1.753 m)     Weight: 70.308 kg (155 lb)     SpO2:  95% 94% 99%     General:  Moderately built and nourished.  Eyes: Anicteric. No pallor.  ENT: No discharge from the ears eyes nose and mouth.  Neck: No mass felt.  Cardiovascular: S1 and S2 heard.  Respiratory: No rhonchi or crepitations.  Abdomen: Soft nontender bowel sounds present.  Skin: No rash.  Musculoskeletal: No edema.  Psychiatric: Appears normal.  Neurologic: Alert awake oriented to time place and person. Moves all extremities.  Labs on Admission:  Basic Metabolic Panel:  Recent Labs Lab 08/15/14 1740 08/15/14 2059  NA 138 139  K 2.8* 3.2*  CL 99* 101  CO2 26 29  GLUCOSE 116* 112*  BUN 14 14  CREATININE 0.95 0.87  CALCIUM 9.1 8.8*  MG  --  2.1   Liver Function Tests:  Recent Labs Lab 08/15/14 1740  AST 37   ALT 32  ALKPHOS 132*  BILITOT 1.8*  PROT 6.7  ALBUMIN 3.2*    Recent Labs Lab 08/15/14 1740  LIPASE 35   No results for input(s): AMMONIA in the last 168 hours. CBC:  Recent Labs Lab 08/15/14 1553  WBC 14.0*  NEUTROABS 12.3*  HGB 14.6  HCT 41.9  MCV 87.1  PLT 211   Cardiac Enzymes:  Recent Labs Lab 08/15/14 2059  CKTOTAL 78    BNP (last 3 results) No results for input(s): BNP in the last 8760 hours.  ProBNP (last 3 results) No results for input(s): PROBNP in the last 8760 hours.  CBG: No results for input(s): GLUCAP in the last 168 hours.  Radiological Exams on Admission: Dg Chest 2 View  08/15/2014   CLINICAL DATA:  Diffuse body pain with nausea and diarrhea for 4 days. Initial encounter.  EXAM: CHEST  2 VIEW  COMPARISON:  PA and lateral chest 04/13/2011 P  FINDINGS: The lungs are clear. Heart size is normal. No pneumothorax or pleural effusion. No focal bony abnormality.  IMPRESSION: Negative chest.   Electronically Signed   By: Inge Rise M.D.   On: 08/15/2014 18:07   Ct Head Wo Contrast  08/15/2014   CLINICAL DATA:  Syncope, generalized weakness.  EXAM: CT HEAD WITHOUT CONTRAST  TECHNIQUE: Contiguous axial images were obtained from the base of the skull through the vertex without intravenous contrast.  COMPARISON:  None.  FINDINGS: Bony calvarium appears intact. Mild chronic ischemic white matter disease is noted. No mass effect or midline shift is noted. Ventricular size is within normal limits. There is no evidence of mass lesion, hemorrhage or acute infarction.  IMPRESSION: Mild chronic ischemic white matter disease. No acute intracranial abnormality seen.   Electronically Signed   By: Marijo Conception, M.D.   On: 08/15/2014 17:38    EKG: Independently reviewed. Normal sinus rhythm with nonspecific ST-T changes. QTC is within acceptable limits.  Assessment/Plan Principal Problem:   Syncope Active Problems:   Weakness   Hypokalemia   UTI (lower  urinary tract infection)   Hypothyroidism   History of rheumatoid arthritis   1. Syncope - cause not clear. Since patient has hypokalemia we will monitor in telemetry to rule out any arrhythmias. Patient was mildly orthostatic for which we will continue hydration and recheck orthostatics in a.m. Since patient had incontinence of bowel movements when patient had a syncopal attack check EEG. Check 2-D echo cycle cardiac markers and check CK levels since patient is complaining of generalized body ache. 2. UTI - patient has been placed on empiric antibiotics. 3. Diarrhea - check stool studies. 4. Hypokalemia probably from diarrhea - replace and recheck. 5. Hypothyroidism - continue Synthroid. 6. History of rheumatoid arthritis on methotrexate. 7. Chronic pain or morphine.   DVT Prophylaxis Lovenox.  Code Status: Full code.  Family Communication: Patient's daughter.  Disposition Plan: Admit for observation.    KAKRAKANDY,ARSHAD N. [facility] Pager [phone].  If 7PM-7AM, please contact night-coverage [email or web address] Password [facility] 08/15/2014, 10:28 PM

## 2014-08-15 NOTE — ED Provider Notes (Signed)
CSN: [id number]     Arrival date & time 08/15/14  1447 History   First MD Initiated Contact with Patient 08/15/14 1629     Chief Complaint  Patient presents with  . Weakness     (Consider location/radiation/quality/duration/timing/severity/associated sxs/prior Treatment) Patient is a 79 y.o. female presenting with weakness. The history is provided by the patient and medical records. No language interpreter was used.  Weakness Associated symptoms include arthralgias, myalgias and weakness. Pertinent negatives include no abdominal pain, chest pain, coughing, diaphoresis, fatigue, fever, headaches, nausea, rash or vomiting.     Lauren Herrera is a 79 y.o. female  with a hx of arthritis, OA, thyroid disease, chronic pain presents to the Emergency Department complaining of gradual, persistent, progressively worsening weakness onset 4 days. Associated symptoms include total body pain rated by 10/10 not relieved by her MS tablets at home.  Pt reports she is in a pain management for chronic back pain, but denies total body pain like she is experiencing total body pain.  MS improves the pain some and nothing makes it worse.  Pt and daughter reports a "high fever" 2 days ago with chills but fever was never measured.  She reports associated diarrhea and dysuria.  Pt denies neck stiffness, CP, SOB, abd pain, nausea, vomiting, hematuria, melena or hematochezia.  Pt reports that she remembers going to bed on Saturday night and she was found on the floor at the foot of her bed by her daughter at little after 6am on Sunday morning.  Pt's daughter reports that she was incontinent of feces during this episode.     Past Medical History  Diagnosis Date  . Arthritis   . Osteoarthritis   . Thyroid disease   . Chronic pain    Past Surgical History  Procedure Laterality Date  . Abdominal hysterectomy    . Cervical fusion    . Cholecystectomy     Family History  Problem Relation Age of Onset  . CAD Neg Hx    . Stroke Neg Hx    History  Substance Use Topics  . Smoking status: Never Smoker   . Smokeless tobacco: Not on file  . Alcohol Use: No   OB History    No data available     Review of Systems  Constitutional: Negative for fever, diaphoresis, appetite change, fatigue and unexpected weight change.  HENT: Negative for mouth sores.   Eyes: Negative for visual disturbance.  Respiratory: Negative for cough, chest tightness, shortness of breath and wheezing.   Cardiovascular: Negative for chest pain.  Gastrointestinal: Negative for nausea, vomiting, abdominal pain, diarrhea and constipation.  Endocrine: Negative for polydipsia, polyphagia and polyuria.  Genitourinary: Negative for dysuria, urgency, frequency and hematuria.  Musculoskeletal: Positive for myalgias, back pain (chronic) and arthralgias. Negative for neck stiffness.  Skin: Negative for rash.  Allergic/Immunologic: Negative for immunocompromised state.  Neurological: Positive for syncope and weakness. Negative for light-headedness and headaches.  Hematological: Does not bruise/bleed easily.  Psychiatric/Behavioral: Negative for sleep disturbance. The patient is not nervous/anxious.       Allergies  Codeine and Latex  Home Medications   Prior to Admission medications   Medication Sig Start Date End Date Taking? Authorizing Provider  atorvastatin (LIPITOR) 10 MG tablet Take 10 mg by mouth daily.   Yes Historical Provider, MD  folic acid (FOLVITE) 355 MCG tablet Take 400 mcg by mouth daily.   Yes Historical Provider, MD  gabapentin (NEURONTIN) 100 MG capsule Take 300 mg by mouth  2 (two) times daily.   Yes Historical Provider, MD  levothyroxine (SYNTHROID, LEVOTHROID) 88 MCG tablet Take 88 mcg by mouth daily.   Yes Historical Provider, MD  methotrexate (RHEUMATREX) 10 MG tablet Take 10 mg by mouth once a week. Caution: Chemotherapy. Protect from light.   Yes Historical Provider, MD  morphine (KADIAN) 60 MG 24 hr capsule  Take 60 mg by mouth 2 (two) times daily.   Yes Historical Provider, MD   BP 161/75 mmHg  Pulse 73  Temp(Src) 98.4 F (36.9 C) (Oral)  Resp 18  Ht 5\' 9"  (1.753 m)  Wt 155 lb (70.308 kg)  BMI 22.88 kg/m2  SpO2 99% Physical Exam  Constitutional: She is oriented to person, place, and time. She appears well-developed and well-nourished. No distress.  Awake, alert, nontoxic appearance  HENT:  Head: Normocephalic and atraumatic.  Mouth/Throat: Oropharynx is clear and moist. No oropharyngeal exudate.  Eyes: Conjunctivae and EOM are normal. Pupils are equal, round, and reactive to light. No scleral icterus.  No horizontal, vertical or rotational nystagmus  Neck: Normal range of motion. Neck supple.  Full active and passive ROM without pain No midline or paraspinal tenderness No nuchal rigidity or meningeal signs  Cardiovascular: Normal rate, regular rhythm, normal heart sounds and intact distal pulses.   No murmur heard. Pulmonary/Chest: Effort normal and breath sounds normal. No respiratory distress. She has no wheezes. She has no rales.  Equal chest expansion  Abdominal: Soft. Bowel sounds are normal. She exhibits no mass. There is no tenderness. There is no rebound and no guarding.  Abdomen soft and nontender  Musculoskeletal: Normal range of motion. She exhibits no edema.  No joint swelling, erythema or increased warmth to any of the major joints  Lymphadenopathy:    She has no cervical adenopathy.  Neurological: She is alert and oriented to person, place, and time. She has normal reflexes. No cranial nerve deficit. She exhibits normal muscle tone. Coordination normal.  Mental Status:  Alert, oriented, thought content appropriate. Speech fluent without evidence of aphasia. Able to follow 2 step commands without difficulty.  Cranial Nerves:  II:  Peripheral visual fields grossly normal, pupils equal, round, reactive to light III,IV, VI: ptosis not present, extra-ocular motions intact  bilaterally  V,VII: smile symmetric, facial light touch sensation equal VIII: hearing grossly normal bilaterally  IX,X: gag reflex present  XI: bilateral shoulder shrug equal and strong XII: midline tongue extension  Motor:  5/5 in upper and lower extremities bilaterally including strong and equal grip strength and dorsiflexion/plantar flexion Sensory: Pinprick and light touch normal in all extremities.  Deep Tendon Reflexes: 2+ and symmetric  Cerebellar: normal finger-to-nose with bilateral upper extremities Gait: normal gait and balance CV: distal pulses palpable throughout   Skin: Skin is warm and dry. No rash noted. She is not diaphoretic.  Psychiatric: She has a normal mood and affect. Her behavior is normal. Judgment and thought content normal.  Nursing note and vitals reviewed.   ED Course  Procedures (including critical care time) Labs Review Labs Reviewed  CBC WITH DIFFERENTIAL/PLATELET - Abnormal; Notable for the following:    WBC 14.0 (*)    Neutrophils Relative % 88 (*)    Neutro Abs 12.3 (*)    Lymphocytes Relative 9 (*)    All other components within normal limits  URINALYSIS, ROUTINE W REFLEX MICROSCOPIC (NOT AT [facility]) - Abnormal; Notable for the following:    APPearance TURBID (*)    Hgb urine dipstick LARGE (*)  Protein, ur 100 (*)    Leukocytes, UA LARGE (*)    All other components within normal limits  COMPREHENSIVE METABOLIC PANEL - Abnormal; Notable for the following:    Potassium 2.8 (*)    Chloride 99 (*)    Glucose, Bld 116 (*)    Albumin 3.2 (*)    Alkaline Phosphatase 132 (*)    Total Bilirubin 1.8 (*)    GFR calc non Af Amer 57 (*)    All other components within normal limits  URINE MICROSCOPIC-ADD ON - Abnormal; Notable for the following:    Squamous Epithelial / LPF FEW (*)    Bacteria, UA MANY (*)    All other components within normal limits  BASIC METABOLIC PANEL - Abnormal; Notable for the following:    Potassium 3.2 (*)    Glucose,  Bld 112 (*)    Calcium 8.8 (*)    All other components within normal limits  CBC - Abnormal; Notable for the following:    WBC 12.4 (*)    All other components within normal limits  URINE CULTURE  CLOSTRIDIUM DIFFICILE BY PCR (NOT AT [facility])  LIPASE, BLOOD  CK  MAGNESIUM  GI PATHOGEN PANEL BY PCR, STOOL  CK  TROPONIN I  BASIC METABOLIC PANEL  CBC  CREATININE, SERUM  I-STAT TROPOININ, ED  I-STAT CG4 LACTIC ACID, ED  I-STAT CG4 LACTIC ACID, ED  CG4 I-STAT (LACTIC ACID)    Imaging Review Dg Chest 2 View  08/15/2014   CLINICAL DATA:  Diffuse body pain with nausea and diarrhea for 4 days. Initial encounter.  EXAM: CHEST  2 VIEW  COMPARISON:  PA and lateral chest 04/13/2011 P  FINDINGS: The lungs are clear. Heart size is normal. No pneumothorax or pleural effusion. No focal bony abnormality.  IMPRESSION: Negative chest.   Electronically Signed   By: Inge Rise M.D.   On: 08/15/2014 18:07   Ct Head Wo Contrast  08/15/2014   CLINICAL DATA:  Syncope, generalized weakness.  EXAM: CT HEAD WITHOUT CONTRAST  TECHNIQUE: Contiguous axial images were obtained from the base of the skull through the vertex without intravenous contrast.  COMPARISON:  None.  FINDINGS: Bony calvarium appears intact. Mild chronic ischemic white matter disease is noted. No mass effect or midline shift is noted. Ventricular size is within normal limits. There is no evidence of mass lesion, hemorrhage or acute infarction.  IMPRESSION: Mild chronic ischemic white matter disease. No acute intracranial abnormality seen.   Electronically Signed   By: Marijo Conception, M.D.   On: 08/15/2014 17:38     EKG Interpretation   Date/Time:  Thursday August 15 2014 15:11:13 EDT Ventricular Rate:  76 PR Interval:  132 QRS Duration: 91 QT Interval:  378 QTC Calculation: 425 R Axis:   5 Text Interpretation:  Sinus rhythm no significant change since Feb 2013  Confirmed by GOLDSTON  MD, Amenia ([id number]) on 08/15/2014 4:41:27 PM       MDM   Final diagnoses:  Weakness  Hypokalemia  UTI (lower urinary tract infection)  Orthostatic hypotension   Lauren Herrera presents with complaints of fatigue and syncope this week. She also complains of total body pain which is new for her.  Will obtain labs, imaging and reassess.  Nonfocal neurologic exam.  Patient with hypokalemia at 2.8. Leukocytosis at 14. Urinalysis with evidence of urinary tract infection. Patient given Rocephin and potassium repleted here in the emergency department. X-ray without evidence of pneumonia or pneumothorax. CT head  with mild chronic ischemic white matter disease but no evidence of acute intercranial abnormality.  Patient has borderline orthostatic vital signs. Suspect that she is dehydrated along with urinary tract infection. Will proceed with admission for rehydration, potassium repletion and treatment of her UTI. Patient is afebrile, non-tachycardic and without hypotension. No evidence of SIRS or sepsis.  Orthostatic VS for the past 24 hrs:  BP- Lying Pulse- Lying BP- Sitting Pulse- Sitting BP- Standing at 0 minutes Pulse- Standing at 0 minutes  08/15/14 1806 164/76 mmHg 79 157/82 mmHg 77 145/84 mmHg 80    Patient discussed with Dr. Hal Hope who will admit.    Jarrett Soho Aleana Fifita, PA-C 08/15/14 2354  Sherwood Gambler, MD 08/20/14 1434

## 2014-08-15 NOTE — Progress Notes (Signed)
ANTIBIOTIC CONSULT NOTE - INITIAL  Pharmacy Consult for Ceftriaxone Indication: UTI  Allergies  Allergen Reactions  . Codeine Nausea And Vomiting  . Latex Rash    Patient Measurements: Height: 5\' 9"  (175.3 cm) Weight: 155 lb (70.308 kg) IBW/kg (Calculated) : 66.2 Adjusted Body Weight:   Vital Signs: Temp: 98.4 F (36.9 C) (06/23 2118) Temp Source: Oral (06/23 2118) BP: 161/75 mmHg (06/23 2118) Pulse Rate: 73 (06/23 2118) Intake/Output from previous day:   Intake/Output from this shift:    Labs:  Recent Labs  08/15/14 1553 08/15/14 1740 08/15/14 2059 08/15/14 2308  WBC 14.0*  --   --  12.4*  HGB 14.6  --   --  14.0  PLT 211  --   --  192  CREATININE  --  0.95 0.87  --    Estimated Creatinine Clearance: 57.5 mL/min (by C-G formula based on Cr of 0.87). No results for input(s): VANCOTROUGH, VANCOPEAK, VANCORANDOM, GENTTROUGH, GENTPEAK, GENTRANDOM, TOBRATROUGH, TOBRAPEAK, TOBRARND, AMIKACINPEAK, AMIKACINTROU, AMIKACIN in the last 72 hours.   Microbiology: No results found for this or any previous visit (from the past 720 hour(s)).  Medical History: Past Medical History  Diagnosis Date  . Arthritis   . Osteoarthritis   . Thyroid disease   . Chronic pain     Medications:  Anti-infectives    Start     Dose/Rate Route Frequency Ordered Stop   08/16/14 2000  cefTRIAXone (ROCEPHIN) 1 g in dextrose 5 % 50 mL IVPB     1 g 100 mL/hr over 30 Minutes Intravenous Every 24 hours 08/15/14 2237     08/15/14 1915  cefTRIAXone (ROCEPHIN) 1 g in dextrose 5 % 50 mL IVPB     1 g 100 mL/hr over 30 Minutes Intravenous  Once 08/15/14 1905 08/15/14 2104     Assessment: Patient with UTI. First dose of antibiotics already given.  Goal of Therapy:  Rocephin based on manufacturer dosing recommendations.  Plan: Ceftriaxone 1gm iv q24hr Follow up culture results  Nani Skillern Crowford 08/15/2014,11:41 PM

## 2014-08-15 NOTE — ED Notes (Addendum)
Per ems pt is from home, c/o all over body pain, nausea, diarrhea x4 days. Hx of rheuamtoid arthritis. Called pain management doctor and was advised to be seen in ED. Pt had syncopal episode on Sunday.  Pain 10/10  20 G L AC, 4mg  zofran given, 253ml NS

## 2014-08-15 NOTE — Progress Notes (Addendum)
MEDICATION RELATED CONSULT NOTE - INITIAL   Pharmacy Consult for [facility] Indication: Methotrexate  Allergies  Allergen Reactions  . Codeine Nausea And Vomiting  . Latex Rash    Patient Measurements: Height: 5\' 9"  (175.3 cm) Weight: 155 lb (70.308 kg) IBW/kg (Calculated) : 66.2 Adjusted Body Weight:   Vital Signs: Temp: 98.4 F (36.9 C) (06/23 2118) Temp Source: Oral (06/23 2118) BP: 161/75 mmHg (06/23 2118) Pulse Rate: 73 (06/23 2118) Intake/Output from previous day:   Intake/Output from this shift:    Labs:  Recent Labs  08/15/14 1553 08/15/14 1740 08/15/14 2059  WBC 14.0*  --   --   HGB 14.6  --   --   HCT 41.9  --   --   PLT 211  --   --   CREATININE  --  0.95 0.87  MG  --   --  2.1  ALBUMIN  --  3.2*  --   PROT  --  6.7  --   AST  --  37  --   ALT  --  32  --   ALKPHOS  --  132*  --   BILITOT  --  1.8*  --    Estimated Creatinine Clearance: 57.5 mL/min (by C-G formula based on Cr of 0.87).   Microbiology: No results found for this or any previous visit (from the past 720 hour(s)).  Medical History: Past Medical History  Diagnosis Date  . Arthritis   . Osteoarthritis   . Thyroid disease   . Chronic pain     Medications:  Prescriptions prior to admission  Medication Sig Dispense Refill Last Dose  . atorvastatin (LIPITOR) 10 MG tablet Take 10 mg by mouth daily.   5/63/8937  . folic acid (FOLVITE) 342 MCG tablet Take 400 mcg by mouth daily.   08/12/2014  . gabapentin (NEURONTIN) 100 MG capsule Take 300 mg by mouth 2 (two) times daily.   08/12/2014  . levothyroxine (SYNTHROID, LEVOTHROID) 88 MCG tablet Take 88 mcg by mouth daily.   08/12/2014  . methotrexate (RHEUMATREX) 10 MG tablet Take 10 mg by mouth once a week. Caution: Chemotherapy. Protect from light.   08/13/2014  . morphine (KADIAN) 60 MG 24 hr capsule Take 60 mg by mouth 2 (two) times daily.   08/14/2014   Methotrexate (Trexall; Rheumatrex) hold criteria  Hgb < 8  WBC < 3  Pltc <  100K  SCr > 1.5x baseline (or > 2 if baseline unknown)  AST or ALT >3x ULN  Bili > 1.5x ULN  Ascites or pleural effusion  Diarrhea - Grade 2 or higher  Ulcerative stomatitis  Unexplained pneumonitis / hypoxemia   Assessment: Patient on methotrexate for RA.  Patient takes dose weekly, last dose noted on 6/21 per Med. Rec.  Patient with diarrhea and T. Bili = 1.5 ULN.  Hold criteria is listed below.  Feel with both of these issues present will discontinue medication at this time, per protocol.    Goal of Therapy:  Safe use of methotrexate in inpatients  Plan:  D/c current methotrexate order Will follow with labs to see if could restart on next planned administration.  Nani Skillern Crowford 08/15/2014,11:00 PM

## 2014-08-15 NOTE — ED Notes (Signed)
Tech attempted to obtain blood for tests, no success

## 2014-08-16 ENCOUNTER — Encounter (HOSPITAL_COMMUNITY): Payer: Self-pay | Admitting: Cardiology

## 2014-08-16 ENCOUNTER — Observation Stay (HOSPITAL_COMMUNITY): Payer: PPO

## 2014-08-16 ENCOUNTER — Observation Stay (HOSPITAL_COMMUNITY)
Admit: 2014-08-16 | Discharge: 2014-08-16 | Disposition: A | Discharge status: Home / Self Care | Payer: PPO | Attending: Internal Medicine | Admitting: Internal Medicine

## 2014-08-16 DIAGNOSIS — R55 Syncope and collapse: Secondary | ICD-10-CM | POA: Diagnosis present

## 2014-08-16 DIAGNOSIS — Z885 Allergy status to narcotic agent status: Secondary | ICD-10-CM | POA: Diagnosis not present

## 2014-08-16 DIAGNOSIS — E876 Hypokalemia: Secondary | ICD-10-CM | POA: Diagnosis not present

## 2014-08-16 DIAGNOSIS — R7989 Other specified abnormal findings of blood chemistry: Secondary | ICD-10-CM | POA: Diagnosis not present

## 2014-08-16 DIAGNOSIS — E869 Volume depletion, unspecified: Secondary | ICD-10-CM | POA: Diagnosis present

## 2014-08-16 DIAGNOSIS — I951 Orthostatic hypotension: Secondary | ICD-10-CM | POA: Diagnosis present

## 2014-08-16 DIAGNOSIS — E039 Hypothyroidism, unspecified: Secondary | ICD-10-CM | POA: Diagnosis present

## 2014-08-16 DIAGNOSIS — M069 Rheumatoid arthritis, unspecified: Secondary | ICD-10-CM | POA: Diagnosis present

## 2014-08-16 DIAGNOSIS — Z79899 Other long term (current) drug therapy: Secondary | ICD-10-CM | POA: Diagnosis not present

## 2014-08-16 DIAGNOSIS — Z79891 Long term (current) use of opiate analgesic: Secondary | ICD-10-CM | POA: Diagnosis not present

## 2014-08-16 DIAGNOSIS — R778 Other specified abnormalities of plasma proteins: Secondary | ICD-10-CM | POA: Diagnosis not present

## 2014-08-16 DIAGNOSIS — N390 Urinary tract infection, site not specified: Secondary | ICD-10-CM | POA: Diagnosis present

## 2014-08-16 DIAGNOSIS — G894 Chronic pain syndrome: Secondary | ICD-10-CM | POA: Diagnosis present

## 2014-08-16 DIAGNOSIS — Z8739 Personal history of other diseases of the musculoskeletal system and connective tissue: Secondary | ICD-10-CM | POA: Diagnosis not present

## 2014-08-16 DIAGNOSIS — E785 Hyperlipidemia, unspecified: Secondary | ICD-10-CM | POA: Diagnosis present

## 2014-08-16 DIAGNOSIS — Z91040 Latex allergy status: Secondary | ICD-10-CM | POA: Diagnosis not present

## 2014-08-16 DIAGNOSIS — R748 Abnormal levels of other serum enzymes: Secondary | ICD-10-CM | POA: Diagnosis present

## 2014-08-16 DIAGNOSIS — R197 Diarrhea, unspecified: Secondary | ICD-10-CM | POA: Diagnosis present

## 2014-08-16 DIAGNOSIS — M1990 Unspecified osteoarthritis, unspecified site: Secondary | ICD-10-CM | POA: Diagnosis present

## 2014-08-16 DIAGNOSIS — Z8249 Family history of ischemic heart disease and other diseases of the circulatory system: Secondary | ICD-10-CM | POA: Diagnosis not present

## 2014-08-16 LAB — TROPONIN I
TROPONIN I: 0.11 ng/mL — AB (ref <0.031)
TROPONIN I: 0.06 ng/mL — AB (ref <0.031)
Troponin I: 0.08 ng/mL — ABNORMAL HIGH (ref <0.031)
Troponin I: 0.06 ng/mL — ABNORMAL HIGH (ref <0.031)

## 2014-08-16 LAB — BASIC METABOLIC PANEL
ANION GAP: 8 (ref 5–15)
BUN: 15 mg/dL (ref 6–20)
CO2: 28 mmol/L (ref 22–32)
Calcium: 8.1 mg/dL — ABNORMAL LOW (ref 8.9–10.3)
Chloride: 104 mmol/L (ref 101–111)
Creatinine, Ser: 0.95 mg/dL (ref 0.44–1.00)
GFR, EST NON AFRICAN AMERICAN: 57 mL/min — AB (ref >60)
Glucose, Bld: 94 mg/dL (ref 65–99)
Potassium: 3.5 mmol/L (ref 3.5–5.1)
Sodium: 140 mmol/L (ref 135–145)

## 2014-08-16 LAB — CBC
HCT: 33.8 % — ABNORMAL LOW (ref 36.0–46.0)
HEMOGLOBIN: 11.3 g/dL — AB (ref 12.0–15.0)
MCH: 29.7 pg (ref 26.0–34.0)
MCHC: 33.4 g/dL (ref 30.0–36.0)
MCV: 88.9 fL (ref 78.0–100.0)
Platelets: 171 10*3/uL (ref 150–400)
RBC: 3.8 MIL/uL — ABNORMAL LOW (ref 3.87–5.11)
RDW: 14.6 % (ref 11.5–15.5)
WBC: 9 10*3/uL (ref 4.0–10.5)

## 2014-08-16 LAB — CK: CK TOTAL: 80 U/L (ref 38–234)

## 2014-08-16 LAB — CREATININE, SERUM
CREATININE: 0.85 mg/dL (ref 0.44–1.00)
GFR calc Af Amer: >60 mL/min (ref >60)
GFR calc non Af Amer: >60 mL/min (ref >60)

## 2014-08-16 MED ORDER — ASPIRIN 325 MG PO TABS
325.0000 mg | ORAL_TABLET | Freq: Every day | ORAL | Status: DC
  Administered 2014-08-16: 325 mg via ORAL
  Filled 2014-08-16 (×2): qty 1

## 2014-08-16 NOTE — Progress Notes (Signed)
  Echocardiogram 2D Echocardiogram has been performed.  Darlina Sicilian M 08/16/2014, 12:31 PM

## 2014-08-16 NOTE — Progress Notes (Signed)
Pharmacy: Re-ceftriaxone  Patient's a 79 y.o F on ceftriaxone day #1 for UTI.  Ucx on 6/23 pending, Afeb, wbc down wnl, scr 1.95 (crcl~53).  Plan: - continue ceftriaxone 1gm IV 24h - pharmacy will sign off since no renal adjustment is needed with ceftriaxone - re-consult pharmacy if need further assistance.  Thank you for allowing pharmacy to participate in this patient's care.  Dia Sitter, PharmD, BCPS 08/16/2014 9:52 AM

## 2014-08-16 NOTE — Procedures (Signed)
ELECTROENCEPHALOGRAM REPORT  Date of Study: 08/16/2014  Patient's Name: Lauren Herrera MRN: [id number] Date of Birth: 1935-10-29  Referring Provider: Dr. Gean Birchwood  Clinical History: This is a 79 year old woman with an episode of unresponsiveness.   Medications: gabapentin (NEURONTIN) capsule 300 mg aspirin tablet 325 mg atorvastatin (LIPITOR) tablet 10 mg cefTRIAXone (ROCEPHIN) 1 g in dextrose 5 % 50 mL IVPB enoxaparin (LOVENOX) injection 40 mg folic acid (FOLVITE) tablet 1 mg levothyroxine (SYNTHROID, LEVOTHROID) tablet 88 mcg morphine (MS CONTIN) 12 hr tablet 60 mg  Technical Summary: A multichannel digital EEG recording measured by the international 10-20 system with electrodes applied with paste and impedances below 5000 ohms performed as portable with EKG monitoring in an awake and asleep patient.  Hyperventilation and photic stimulation were not performed.  The digital EEG was referentially recorded, reformatted, and digitally filtered in a variety of bipolar and referential montages for optimal display.   Description: The patient is awake and asleep during the recording.  During maximal wakefulness, there is a symmetric, medium voltage 7.5 Hz posterior dominant rhythm that attenuates with eye opening. This is admixed with a moderate amount of diffuse 4-5 Hz theta and 2-3 Hz delta slowing of the waking background, with frontal intermittent rhythmic delta activity (FIRDA) seen throughout the recording.  During drowsiness and sleep, there is an increase in theta and delta slowing of the background. There were no epileptiform discharges or electrographic seizures seen.    EKG lead showed irregular rhythm.  Impression: This awake and asleep EEG is abnormal due to the presence of: 1. Slowing of the posterior dominant rhythm 2. Moderate diffuse slowing of the waking background with frontal intermittent rhythmic delta activity (FIRDA) seen  Clinical Correlation of the above  findings indicates diffuse cerebral dysfunction that is non-specific in etiology and can be seen with hypoxic/ischemic injury, toxic/metabolic encephalopathies, or medication effect. FIRDA typically suggests diffuse cerebral dysfunction, but can be seen with deep midline lesions as well.  The absence of epileptiform discharges does not rule out a clinical diagnosis of epilepsy.  Clinical correlation is advised.   Ellouise Newer, M.D.

## 2014-08-16 NOTE — Consult Note (Signed)
Reason for Consult: Possible syncope   Referring Physician: Dr. Hal Hope   PCP:  Reginia Naas, MD  Primary Cardiologist:new  Lauren Herrera is an 79 y.o. female.    Chief Complaint: admitted 08/15/14 with weakness and being found on floor by her daughter Sunday AM and pt only remembers going to bed Sat night.    HPI: Lauren Herrera is a 79 y.o. female with history of rheumatoid arthritis on methotrexate, hyperlipidemia, hypothyroidism and chronic pain was brought to the ER after patient was complaining of generalized body ache and had an episode of loss of consciousness 4 days ago- though as pt does not know what happened only found on floor by bed by her daughter. As per the patient's daughter 4 days ago patient was found on the floor near her bed unconscious in the morning. When patient's daughter woke her up the patient was immediately responsive. Patient does not remember how she loss consciousness. Patient did have bowel incontinence then but no bladder incontinence. . Since the fall patient has been having generalized body ache and has had recurrent episodes of diarrhea. Patient denies taking any recent antibiotics or recent admissions. In the ER patient was mildly orthostatic (with lying 164/76; sitting 157/82; and standing 145/84). Labs revealed hypokalemia. CT head did not show anything acute. EKG was showing normal sinus rhythm with nonspecific ST-T changes and no prolonged QTC. Patient has been admitted for further workup of syncope diarrhea and UTI.  Cardiology consult for possible syncope.   Pt has no chest pain and no SOB she walks 2-3 miles per day.  She does her own housekeeping and vacuuming.  She at times takes benadryl for sleep but none on Sat.  She has no recollection of event -she went to bed and then woke when her daughter was yelling for her.    No prior cardiac hx.   Troponin in ER 0.08 and now 0.11-->0.08  K+ up from 2.8 to 3.5 today. CK was 80 EKG  SR non specific ST changes with no acute changes from 2013.  Past Medical History  Diagnosis Date  . Arthritis   . Osteoarthritis   . Thyroid disease   . Chronic pain     Past Surgical History  Procedure Laterality Date  . Abdominal hysterectomy    . Cervical fusion    . Cholecystectomy      Family History  Problem Relation Age of Onset  . CAD Neg Hx   . Stroke Neg Hx   . Heart disease Mother 3    had CABG  . Heart disease Brother     stents   Social History:  reports that she has never smoked. She does not have any smokeless tobacco history on file. She reports that she does not drink alcohol or use illicit drugs.  Allergies:  Allergies  Allergen Reactions  . Codeine Nausea And Vomiting  . Latex Rash    OUTPATIENT MEDICATIONS: No current facility-administered medications on file prior to encounter.   Current Outpatient Prescriptions on File Prior to Encounter  Medication Sig Dispense Refill  . atorvastatin (LIPITOR) 10 MG tablet Take 10 mg by mouth daily.    . methotrexate (RHEUMATREX) 10 MG tablet Take 10 mg by mouth once a week. Caution: Chemotherapy. Protect from light.     Current Medications: Scheduled Meds: . atorvastatin  10 mg Oral q1800  . cefTRIAXone (ROCEPHIN) IVPB 1 gram/50 mL D5W  1 g Intravenous Q24H  .  enoxaparin (LOVENOX) injection  40 mg Subcutaneous QHS  . folic acid  8,032 mcg Oral Daily  . gabapentin  300 mg Oral BID  . levothyroxine  88 mcg Oral QAC breakfast  . morphine  60 mg Oral Q12H  . saccharomyces boulardii  250 mg Oral BID   Continuous Infusions: . 0.9 % NaCl with KCl 20 mEq / L 100 mL/hr at 08/15/14 2259   PRN Meds:.acetaminophen **OR** acetaminophen, ondansetron **OR** ondansetron (ZOFRAN) IV   Results for orders placed or performed during the hospital encounter of 08/15/14 (from the past 48 hour(s))  Urinalysis, Routine w reflex microscopic (not at [facility])     Status: Abnormal   Collection Time: 08/15/14  3:08 PM  Result  Value Ref Range   Color, Urine YELLOW YELLOW   APPearance TURBID (A) CLEAR   Specific Gravity, Urine 1.010 1.005 - 1.030   pH 6.5 5.0 - 8.0   Glucose, UA NEGATIVE NEGATIVE mg/dL   Hgb urine dipstick LARGE (A) NEGATIVE   Bilirubin Urine NEGATIVE NEGATIVE   Ketones, ur NEGATIVE NEGATIVE mg/dL   Protein, ur 100 (A) NEGATIVE mg/dL   Urobilinogen, UA 0.2 0.0 - 1.0 mg/dL   Nitrite NEGATIVE NEGATIVE   Leukocytes, UA LARGE (A) NEGATIVE  Urine microscopic-add on     Status: Abnormal   Collection Time: 08/15/14  3:08 PM  Result Value Ref Range   Squamous Epithelial / LPF FEW (A) RARE   WBC, UA 21-50 <3 WBC/hpf   RBC / HPF 3-6 <3 RBC/hpf   Bacteria, UA MANY (A) RARE  CBC with Differential     Status: Abnormal   Collection Time: 08/15/14  3:53 PM  Result Value Ref Range   WBC 14.0 (H) 4.0 - 10.5 K/uL   RBC 4.81 3.87 - 5.11 MIL/uL   Hemoglobin 14.6 12.0 - 15.0 g/dL   HCT 41.9 36.0 - 46.0 %   MCV 87.1 78.0 - 100.0 fL   MCH 30.4 26.0 - 34.0 pg   MCHC 34.8 30.0 - 36.0 g/dL   RDW 14.4 11.5 - 15.5 %   Platelets 211 150 - 400 K/uL   Neutrophils Relative % 88 (H) 43 - 77 %   Neutro Abs 12.3 (H) 1.7 - 7.7 K/uL   Lymphocytes Relative 9 (L) 12 - 46 %   Lymphs Abs 1.3 0.7 - 4.0 K/uL   Monocytes Relative 3 3 - 12 %   Monocytes Absolute 0.4 0.1 - 1.0 K/uL   Eosinophils Relative 0 0 - 5 %   Eosinophils Absolute 0.0 0.0 - 0.7 K/uL   Basophils Relative 0 0 - 1 %   Basophils Absolute 0.0 0.0 - 0.1 K/uL  I-stat troponin, ED (only if pt is 77 y.o. or older & pain is above umbilicus)  not at [facility]     Status: None   Collection Time: 08/15/14  4:01 PM  Result Value Ref Range   Troponin i, poc 0.08 0.00 - 0.08 ng/mL   Comment 3            Comment: Due to the release kinetics of cTnI, a negative result within the first hours of the onset of symptoms does not rule out myocardial infarction with certainty. If myocardial infarction is still suspected, repeat the test at appropriate intervals.    Comprehensive metabolic panel     Status: Abnormal   Collection Time: 08/15/14  5:40 PM  Result Value Ref Range   Sodium 138 135 - 145 mmol/L   Potassium  2.8 (L) 3.5 - 5.1 mmol/L   Chloride 99 (L) 101 - 111 mmol/L   CO2 26 22 - 32 mmol/L   Glucose, Bld 116 (H) 65 - 99 mg/dL   BUN 14 6 - 20 mg/dL   Creatinine, Ser 0.95 0.44 - 1.00 mg/dL   Calcium 9.1 8.9 - 10.3 mg/dL   Total Protein 6.7 6.5 - 8.1 g/dL   Albumin 3.2 (L) 3.5 - 5.0 g/dL   AST 37 15 - 41 U/L   ALT 32 14 - 54 U/L   Alkaline Phosphatase 132 (H) 38 - 126 U/L   Total Bilirubin 1.8 (H) 0.3 - 1.2 mg/dL   GFR calc non Af Amer 57 (L) >60 mL/min   GFR calc Af Amer >60 >60 mL/min    Comment: (NOTE) The eGFR has been calculated using the CKD EPI equation. This calculation has not been validated in all clinical situations. eGFR's persistently <60 mL/min signify possible Chronic Kidney Disease.    Anion gap 13 5 - 15  Lipase, blood     Status: None   Collection Time: 08/15/14  5:40 PM  Result Value Ref Range   Lipase 35 22 - 51 U/L  I-Stat CG4 Lactic Acid, ED     Status: None   Collection Time: 08/15/14  5:54 PM  Result Value Ref Range   Lactic Acid, Venous 1.73 0.5 - 2.0 mmol/L  Basic metabolic panel     Status: Abnormal   Collection Time: 08/15/14  8:59 PM  Result Value Ref Range   Sodium 139 135 - 145 mmol/L   Potassium 3.2 (L) 3.5 - 5.1 mmol/L   Chloride 101 101 - 111 mmol/L   CO2 29 22 - 32 mmol/L   Glucose, Bld 112 (H) 65 - 99 mg/dL   BUN 14 6 - 20 mg/dL   Creatinine, Ser 0.87 0.44 - 1.00 mg/dL   Calcium 8.8 (L) 8.9 - 10.3 mg/dL   GFR calc non Af Amer >60 >60 mL/min   GFR calc Af Amer >60 >60 mL/min    Comment: (NOTE) The eGFR has been calculated using the CKD EPI equation. This calculation has not been validated in all clinical situations. eGFR's persistently <60 mL/min signify possible Chronic Kidney Disease.    Anion gap 9 5 - 15  CK     Status: None   Collection Time: 08/15/14  8:59 PM  Result Value  Ref Range   Total CK 78 38 - 234 U/L  Magnesium     Status: None   Collection Time: 08/15/14  8:59 PM  Result Value Ref Range   Magnesium 2.1 1.7 - 2.4 mg/dL  CG4 I-STAT (Lactic acid)     Status: None   Collection Time: 08/15/14  9:08 PM  Result Value Ref Range   Lactic Acid, Venous 1.02 0.5 - 2.0 mmol/L  CK     Status: None   Collection Time: 08/15/14 11:08 PM  Result Value Ref Range   Total CK 80 38 - 234 U/L  Troponin I     Status: Abnormal   Collection Time: 08/15/14 11:08 PM  Result Value Ref Range   Troponin I 0.11 (H) <0.031 ng/mL    Comment:        PERSISTENTLY INCREASED TROPONIN VALUES IN THE RANGE OF 0.04-0.49 ng/mL CAN BE SEEN IN:       -UNSTABLE ANGINA       -CONGESTIVE HEART FAILURE       -MYOCARDITIS       -  CHEST TRAUMA       -ARRYHTHMIAS       -LATE PRESENTING MYOCARDIAL INFARCTION       -COPD   CLINICAL FOLLOW-UP RECOMMENDED.   CBC     Status: Abnormal   Collection Time: 08/15/14 11:08 PM  Result Value Ref Range   WBC 12.4 (H) 4.0 - 10.5 K/uL   RBC 4.61 3.87 - 5.11 MIL/uL   Hemoglobin 14.0 12.0 - 15.0 g/dL   HCT 40.8 36.0 - 46.0 %   MCV 88.5 78.0 - 100.0 fL   MCH 30.4 26.0 - 34.0 pg   MCHC 34.3 30.0 - 36.0 g/dL   RDW 14.6 11.5 - 15.5 %   Platelets 192 150 - 400 K/uL  Creatinine, serum     Status: None   Collection Time: 08/15/14 11:08 PM  Result Value Ref Range   Creatinine, Ser 0.85 0.44 - 1.00 mg/dL   GFR calc non Af Amer >60 >60 mL/min   GFR calc Af Amer >60 >60 mL/min    Comment: (NOTE) The eGFR has been calculated using the CKD EPI equation. This calculation has not been validated in all clinical situations. eGFR's persistently <60 mL/min signify possible Chronic Kidney Disease.   Basic metabolic panel     Status: Abnormal   Collection Time: 08/16/14  5:29 AM  Result Value Ref Range   Sodium 140 135 - 145 mmol/L   Potassium 3.5 3.5 - 5.1 mmol/L   Chloride 104 101 - 111 mmol/L   CO2 28 22 - 32 mmol/L   Glucose, Bld 94 65 - 99 mg/dL    BUN 15 6 - 20 mg/dL   Creatinine, Ser 0.95 0.44 - 1.00 mg/dL   Calcium 8.1 (L) 8.9 - 10.3 mg/dL   GFR calc non Af Amer 57 (L) >60 mL/min   GFR calc Af Amer >60 >60 mL/min    Comment: (NOTE) The eGFR has been calculated using the CKD EPI equation. This calculation has not been validated in all clinical situations. eGFR's persistently <60 mL/min signify possible Chronic Kidney Disease.    Anion gap 8 5 - 15  CBC     Status: Abnormal   Collection Time: 08/16/14  5:29 AM  Result Value Ref Range   WBC 9.0 4.0 - 10.5 K/uL   RBC 3.80 (L) 3.87 - 5.11 MIL/uL   Hemoglobin 11.3 (L) 12.0 - 15.0 g/dL   HCT 33.8 (L) 36.0 - 46.0 %   MCV 88.9 78.0 - 100.0 fL   MCH 29.7 26.0 - 34.0 pg   MCHC 33.4 30.0 - 36.0 g/dL   RDW 14.6 11.5 - 15.5 %   Platelets 171 150 - 400 K/uL  Troponin I (q 6hr x 3)     Status: Abnormal   Collection Time: 08/16/14  5:29 AM  Result Value Ref Range   Troponin I 0.08 (H) <0.031 ng/mL    Comment:        PERSISTENTLY INCREASED TROPONIN VALUES IN THE RANGE OF 0.04-0.49 ng/mL CAN BE SEEN IN:       -UNSTABLE ANGINA       -CONGESTIVE HEART FAILURE       -MYOCARDITIS       -CHEST TRAUMA       -ARRYHTHMIAS       -LATE PRESENTING MYOCARDIAL INFARCTION       -COPD   CLINICAL FOLLOW-UP RECOMMENDED.    Dg Chest 2 View  08/15/2014   CLINICAL DATA:  Diffuse body pain with nausea and diarrhea  for 4 days. Initial encounter.  EXAM: CHEST  2 VIEW  COMPARISON:  PA and lateral chest 04/13/2011 P  FINDINGS: The lungs are clear. Heart size is normal. No pneumothorax or pleural effusion. No focal bony abnormality.  IMPRESSION: Negative chest.   Electronically Signed   By: Inge Rise M.D.   On: 08/15/2014 18:07   Ct Head Wo Contrast  08/15/2014   CLINICAL DATA:  Syncope, generalized weakness.  EXAM: CT HEAD WITHOUT CONTRAST  TECHNIQUE: Contiguous axial images were obtained from the base of the skull through the vertex without intravenous contrast.  COMPARISON:  None.  FINDINGS: Bony  calvarium appears intact. Mild chronic ischemic white matter disease is noted. No mass effect or midline shift is noted. Ventricular size is within normal limits. There is no evidence of mass lesion, hemorrhage or acute infarction.  IMPRESSION: Mild chronic ischemic white matter disease. No acute intracranial abnormality seen.   Electronically Signed   By: Marijo Conception, M.D.   On: 08/15/2014 17:38    ROS: General:no colds or fevers, no weight changes Skin:no rashes or ulcers HEENT:no blurred vision, no congestion CV:see HPI PUL:see HPI GI:no diarrhea constipation or melena, no indigestion GU:no hematuria, no dysuria MS:+ RA joint pain, no claudication Neuro:no syncope, no lightheadedness ? Cause of being in the floor Endo:no diabetes, + thyroid disease   Blood pressure 125/72, pulse 66, temperature 97.4 F (36.3 C), temperature source Oral, resp. rate 18, height '5\' 9"'  (1.753 m), weight 155 lb (70.308 kg), SpO2 95 %.  Wt Readings from Last 3 Encounters:  08/15/14 155 lb (70.308 kg)  04/13/11 147 lb (66.679 kg)    PE: General:Pleasant affect, NAD Skin:Warm and dry, brisk capillary refill HEENT:normocephalic, sclera clear, mucus membranes moist Neck:supple, no JVD, no bruits  Heart:S1S2 RRR without murmur, gallup, rub or click Lungs:clear without rales, rhonchi, or wheezes HYI:FOYD, non tender, + BS, do not palpate liver spleen or masses Ext:no lower ext edema, 2+ pedal pulses, 2+ radial pulses Neuro:alert and oriented, MAE, follows commands, + facial symmetry   Tele:  SR with PACs   Assessment/Plan Principal Problem:   Syncope Active Problems:   Weakness   Hypokalemia   UTI (lower urinary tract infection)   Hypothyroidism   History of rheumatoid arthritis   Elevated troponin  1. Syncope- pt has no recollection as to what happened or how long she may have been on the floor- may need event monitor to be sure no arrhthymias.  No previous dizzy spells or lightheadedness.     2. Elevated troponin may be due to demand ischemia mildly elevated- but with age may be prudent to do lexiscan myoview depending on Echo- she walks 2-3 miles per day and has no chest pain or SOB but FH of CAD with her mother in her 48s.   3. ? UTI may be cause of weakness and being in the floor.  Treatment per Im.    Kamiah  Nurse Practitioner Certified [place] Pager [phone] or after 5pm or weekends call [phone] 08/16/2014, 10:14 AM     History and all data above reviewed.  Patient examined.  I agree with the findings as above.  The patient has had no prior cardiac history.  She has no history of palpitations, presyncope, orthostatic symptoms.  In hospital there has been no arrhythmia (Tele labels everything as atrial fib but that is not correct.)  She does have PACs.  The troponin was slightly elevated. The patient exam  reveals VBT:YOMAYOK,  Lungs: Clear  ,  Abd: Positive bowel sounds, no rebound no guarding, Ext No edema    All available labs, radiology testing, previous records reviewed. Agree with documented assessment and plan. Syncope:  This could have been related to the 4 days of diarrhea that she was having before the event.  I doubt a primary cardiac event.  Echo prelim is normal.  I don't think that out patient telemetry will be helpful.  She has a borderline (nonspecific) enzyme elevation.  She could have an out patient POET (Plain Old Exercise Treadmill) in the weeks to come.  We will arrange a six week follow up.  We will see again in the AM to follow up the echo.     Lauren Herrera  11:53 AM  08/16/2014

## 2014-08-16 NOTE — Evaluation (Signed)
Physical Therapy One Time Evaluation Patient Details Name: Lauren Herrera MRN: [id number] DOB: May 04, 1935 Today's Date: 08/16/2014   History of Present Illness  Pt is a 79 year old female with history of rheumatoid arthritis on methotrexate, hyperlipidemia, hypothyroidism and chronic pain admitted after an episode of loss of consciousness a few days ago; work up for syncope, diarrhea, UTI, hypokalemia  Clinical Impression  Patient evaluated by Physical Therapy with no further acute PT needs identified. All education has been completed and the patient has no further questions. Pt mobilizing without difficulty and denies any dizziness today.  Orthostatics obtained and documented in flowsheets (negative).  See below for any follow-up Physical Therapy or equipment needs. PT is signing off. Thank you for this referral.      Follow Up Recommendations No PT follow up    Equipment Recommendations  None recommended by PT    Recommendations for Other Services       Precautions / Restrictions Precautions Precautions: Fall      Mobility  Bed Mobility Overal bed mobility: Modified Independent                Transfers Overall transfer level: Modified independent                  Ambulation/Gait Ambulation/Gait assistance: Modified independent (Device/Increase time) Ambulation Distance (Feet): 250 Feet Assistive device: None Gait Pattern/deviations: WFL(Within Functional Limits)     General Gait Details: slow but steady gait, no LOB observed, pt denies dizziness  Stairs            Wheelchair Mobility    Modified Rankin (Stroke Patients Only)       Balance                                             Pertinent Vitals/Pain Pain Assessment: No/denies pain    Home Living Family/patient expects to be discharged to:: Private residence Living Arrangements: Children (daughter)   Type of Home: House       Home Layout: Two level;Bed/bath  upstairs Home Equipment: None      Prior Function Level of Independence: Independent               Hand Dominance        Extremity/Trunk Assessment   Upper Extremity Assessment: Overall WFL for tasks assessed           Lower Extremity Assessment: Overall WFL for tasks assessed         Communication   Communication: No difficulties  Cognition Arousal/Alertness: Awake/alert Behavior During Therapy: WFL for tasks assessed/performed Overall Cognitive Status: Within Functional Limits for tasks assessed                      General Comments      Exercises        Assessment/Plan    PT Assessment Patent does not need any further PT services  PT Diagnosis     PT Problem List    PT Treatment Interventions     PT Goals (Current goals can be found in the Care Plan section) Acute Rehab PT Goals PT Goal Formulation: All assessment and education complete, DC therapy    Frequency     Barriers to discharge        Co-evaluation  End of Session   Activity Tolerance: Patient tolerated treatment well Patient left: in chair;with call bell/phone within reach;with nursing/sitter in room      Functional Assessment Tool Used: clinical judgement Functional Limitation: Mobility: Walking and moving around Mobility: Walking and Moving Around Current Status [phone]): 0 percent impaired, limited or restricted Mobility: Walking and Moving Around Goal Status [phone]): 0 percent impaired, limited or restricted Mobility: Walking and Moving Around Discharge Status [phone]): 0 percent impaired, limited or restricted    Time: 1003-1023 PT Time Calculation (min) (ACUTE ONLY): 20 min   Charges:   PT Evaluation $Initial PT Evaluation Tier I: 1 Procedure     PT G Codes:   PT G-Codes **NOT FOR INPATIENT CLASS** Functional Assessment Tool Used: clinical judgement Functional Limitation: Mobility: Walking and moving around Mobility: Walking and Moving  Around Current Status (A1518): 0 percent impaired, limited or restricted Mobility: Walking and Moving Around Goal Status (D4373): 0 percent impaired, limited or restricted Mobility: Walking and Moving Around Discharge Status (H7897): 0 percent impaired, limited or restricted    Tekeyah Santiago,KATHrine E 08/16/2014, 12:52 PM Carmelia Bake, PT, DPT 08/16/2014 Pager: [phone]

## 2014-08-16 NOTE — Progress Notes (Signed)
EEG completed; results pending.    

## 2014-08-16 NOTE — Plan of Care (Signed)
Problem: Phase I Progression Outcomes Goal: OOB as tolerated unless otherwise ordered Outcome: Completed/Met Date Met:  08/16/14 Out of bed to chair today without difficulty.

## 2014-08-16 NOTE — Progress Notes (Signed)
Patient was sinus rhythm with PAC's on telemetry, B/p 125/72,hr 66,resp 16, Dr Fanny Bien notified. No orders received at this time. Will continue to monitor patient.

## 2014-08-16 NOTE — Progress Notes (Signed)
[facility] PROGRESS NOTE  Lauren Herrera FVC:[id number] DOB: Jul 11, 1935 DOA: 08/15/2014 PCP: Reginia Naas, MD  Assessment/Plan: 1. Syncope-suspect orthostatic -Initial Orthostatics vitals almost positive (64mmHg drop -SBP), repeat improved, also h/o diarrhea x1 week -IVF x12 more hours -also Mildly elevated troponin, no H/o CAD, no chest pain, add ASA -Cards consulting -FU ECHO   2. UTI -continue rocephin, FU cultures  3. Diarrhea -likely viral gastroenteritis, resolved  4. Hypokalemia -corrected, due to diarrhea, resolved  5. Hypothyroidism - continue Synthroid  6. History of rheumatoid arthritis on methotrexate.  7. Chronic pain on morphine.  Code Status: Full Code Family Communication: daughter at bedside Disposition Plan: home in 1-2days   Consultants: Cards  HPI/Subjective: Feels much better, no further diarrhea since admission  Objective: Filed Vitals:   08/16/14 0752  BP: 125/72  Pulse: 66  Temp: 97.4 F (36.3 C)  Resp: 18    Intake/Output Summary (Last 24 hours) at 08/16/14 1056 Last data filed at 08/16/14 0600  Gross per 24 hour  Intake 751.67 ml  Output      0 ml  Net 751.67 ml   Filed Weights   08/15/14 1747  Weight: 70.308 kg (155 lb)    Exam:   General:  AAOx3, pleasant, avg built  Cardiovascular: S1S2/RRR  Respiratory: CTAB  Abdomen: soft, NT, BS present  Musculoskeletal: trace edema    Data Reviewed: Basic Metabolic Panel:  Recent Labs Lab 08/15/14 1740 08/15/14 2059 08/15/14 2308 08/16/14 0529  NA 138 139  --  140  K 2.8* 3.2*  --  3.5  CL 99* 101  --  104  CO2 26 29  --  28  GLUCOSE 116* 112*  --  94  BUN 14 14  --  15  CREATININE 0.95 0.87 0.85 0.95  CALCIUM 9.1 8.8*  --  8.1*  MG  --  2.1  --   --    Liver Function Tests:  Recent Labs Lab 08/15/14 1740  AST 37  ALT 32  ALKPHOS 132*  BILITOT 1.8*  PROT 6.7  ALBUMIN 3.2*    Recent Labs Lab 08/15/14 1740  LIPASE 35   No results  for input(s): AMMONIA in the last 168 hours. CBC:  Recent Labs Lab 08/15/14 1553 08/15/14 2308 08/16/14 0529  WBC 14.0* 12.4* 9.0  NEUTROABS 12.3*  --   --   HGB 14.6 14.0 11.3*  HCT 41.9 40.8 33.8*  MCV 87.1 88.5 88.9  PLT 211 192 171   Cardiac Enzymes:  Recent Labs Lab 08/15/14 2059 08/15/14 2308 08/16/14 0529  CKTOTAL 78 80  --   TROPONINI  --  0.11* 0.08*   BNP (last 3 results) No results for input(s): BNP in the last 8760 hours.  ProBNP (last 3 results) No results for input(s): PROBNP in the last 8760 hours.  CBG: No results for input(s): GLUCAP in the last 168 hours.  No results found for this or any previous visit (from the past 240 hour(s)).   Studies: Dg Chest 2 View  08/15/2014   CLINICAL DATA:  Diffuse body pain with nausea and diarrhea for 4 days. Initial encounter.  EXAM: CHEST  2 VIEW  COMPARISON:  PA and lateral chest 04/13/2011 P  FINDINGS: The lungs are clear. Heart size is normal. No pneumothorax or pleural effusion. No focal bony abnormality.  IMPRESSION: Negative chest.   Electronically Signed   By: Inge Rise M.D.   On: 08/15/2014 18:07   Ct Head Wo Contrast  08/15/2014  CLINICAL DATA:  Syncope, generalized weakness.  EXAM: CT HEAD WITHOUT CONTRAST  TECHNIQUE: Contiguous axial images were obtained from the base of the skull through the vertex without intravenous contrast.  COMPARISON:  None.  FINDINGS: Bony calvarium appears intact. Mild chronic ischemic white matter disease is noted. No mass effect or midline shift is noted. Ventricular size is within normal limits. There is no evidence of mass lesion, hemorrhage or acute infarction.  IMPRESSION: Mild chronic ischemic white matter disease. No acute intracranial abnormality seen.   Electronically Signed   By: Marijo Conception, M.D.   On: 08/15/2014 17:38    Scheduled Meds: . atorvastatin  10 mg Oral q1800  . cefTRIAXone (ROCEPHIN) IVPB 1 gram/50 mL D5W  1 g Intravenous Q24H  . enoxaparin  (LOVENOX) injection  40 mg Subcutaneous QHS  . folic acid  8,101 mcg Oral Daily  . gabapentin  300 mg Oral BID  . levothyroxine  88 mcg Oral QAC breakfast  . morphine  60 mg Oral Q12H  . saccharomyces boulardii  250 mg Oral BID   Continuous Infusions: . 0.9 % NaCl with KCl 20 mEq / L 100 mL/hr at 08/15/14 2259   Antibiotics Given (last 72 hours)    None      Principal Problem:   Syncope Active Problems:   Weakness   Hypokalemia   UTI (lower urinary tract infection)   Hypothyroidism   History of rheumatoid arthritis   Elevated troponin    Time spent: 70min    [facility] Pager [phone]. If 7PM-7AM, please contact night-coverage at [email or web address], password [facility] 08/16/2014, 10:56 AM

## 2014-08-17 DIAGNOSIS — E876 Hypokalemia: Secondary | ICD-10-CM

## 2014-08-17 DIAGNOSIS — Z8739 Personal history of other diseases of the musculoskeletal system and connective tissue: Secondary | ICD-10-CM

## 2014-08-17 DIAGNOSIS — R7989 Other specified abnormal findings of blood chemistry: Secondary | ICD-10-CM

## 2014-08-17 DIAGNOSIS — R55 Syncope and collapse: Secondary | ICD-10-CM

## 2014-08-17 DIAGNOSIS — E039 Hypothyroidism, unspecified: Secondary | ICD-10-CM

## 2014-08-17 MED ORDER — SACCHAROMYCES BOULARDII 250 MG PO CAPS: 250.0000 mg | ORAL_CAPSULE | Freq: Two times a day (BID) | ORAL | Status: AC

## 2014-08-17 MED ORDER — CEPHALEXIN 500 MG PO CAPS: 500.0000 mg | ORAL_CAPSULE | Freq: Three times a day (TID) | ORAL | Status: AC

## 2014-08-17 MED ORDER — ASPIRIN 81 MG PO TABS: 81.0000 mg | ORAL_TABLET | Freq: Every day | ORAL | Status: AC

## 2014-08-17 NOTE — Discharge Summary (Signed)
PATIENT DETAILS Name: Lauren Herrera Age: 79 y.o. Sex: female Date of Birth: 29-May-1935 MRN: [id number]. Admitting Physician: Rise Patience, MD YME:BRAXE,NMMHWKG Weyman Croon, MD  Admit Date: 08/15/2014 Discharge date: 08/17/2014  Recommendations for Outpatient Follow-up:  Urine culture pending at the time of discharge-please follow.   PRIMARY DISCHARGE DIAGNOSIS:  Principal Problem:   Syncope Active Problems:   Weakness   Hypokalemia   UTI (lower urinary tract infection)   Hypothyroidism   History of rheumatoid arthritis   Elevated troponin      PAST MEDICAL HISTORY: Past Medical History  Diagnosis Date  . Arthritis   . Osteoarthritis   . Thyroid disease   . Chronic pain     DISCHARGE MEDICATIONS: Current Discharge Medication List    START taking these medications   Details  aspirin 81 MG tablet Take 1 tablet (81 mg total) by mouth daily.    cephALEXin (KEFLEX) 500 MG capsule Take 1 capsule (500 mg total) by mouth 3 (three) times daily. Qty: 6 capsule, Refills: 0    saccharomyces boulardii (FLORASTOR) 250 MG capsule Take 1 capsule (250 mg total) by mouth 2 (two) times daily. Qty: 30 capsule, Refills: 0      CONTINUE these medications which have NOT CHANGED   Details  atorvastatin (LIPITOR) 10 MG tablet Take 10 mg by mouth daily.    folic acid (FOLVITE) 881 MCG tablet Take 400 mcg by mouth daily.    gabapentin (NEURONTIN) 100 MG capsule Take 300 mg by mouth 2 (two) times daily.    levothyroxine (SYNTHROID, LEVOTHROID) 88 MCG tablet Take 88 mcg by mouth daily.    methotrexate (RHEUMATREX) 10 MG tablet Take 10 mg by mouth once a week. Caution: Chemotherapy. Protect from light.    morphine (KADIAN) 60 MG 24 hr capsule Take 60 mg by mouth 2 (two) times daily.        ALLERGIES:   Allergies  Allergen Reactions  . Codeine Nausea And Vomiting  . Latex Rash    BRIEF HPI:  See H&P, Labs, Consult and Test reports for all details in brief, patient is a  79 year old female with a history of rheumatoid arthritis, dyslipidemia, chronic pain syndrome on chronic narcotics was admitted for evaluation of syncope. Apparently a few days prior to this admission patient was having diarrhea.  CONSULTATIONS:   cardiology  PERTINENT RADIOLOGIC STUDIES: Dg Chest 2 View  08/15/2014   CLINICAL DATA:  Diffuse body pain with nausea and diarrhea for 4 days. Initial encounter.  EXAM: CHEST  2 VIEW  COMPARISON:  PA and lateral chest 04/13/2011 P  FINDINGS: The lungs are clear. Heart size is normal. No pneumothorax or pleural effusion. No focal bony abnormality.  IMPRESSION: Negative chest.   Electronically Signed   By: Inge Rise M.D.   On: 08/15/2014 18:07   Ct Head Wo Contrast  08/15/2014   CLINICAL DATA:  Syncope, generalized weakness.  EXAM: CT HEAD WITHOUT CONTRAST  TECHNIQUE: Contiguous axial images were obtained from the base of the skull through the vertex without intravenous contrast.  COMPARISON:  None.  FINDINGS: Bony calvarium appears intact. Mild chronic ischemic white matter disease is noted. No mass effect or midline shift is noted. Ventricular size is within normal limits. There is no evidence of mass lesion, hemorrhage or acute infarction.  IMPRESSION: Mild chronic ischemic white matter disease. No acute intracranial abnormality seen.   Electronically Signed   By: Marijo Conception, M.D.   On: 08/15/2014 17:38  PERTINENT LAB RESULTS: CBC:  Recent Labs  08/15/14 2308 08/16/14 0529  WBC 12.4* 9.0  HGB 14.0 11.3*  HCT 40.8 33.8*  PLT 192 171   CMET CMP     Component Value Date/Time   NA 140 08/16/2014 0529   K 3.5 08/16/2014 0529   CL 104 08/16/2014 0529   CO2 28 08/16/2014 0529   GLUCOSE 94 08/16/2014 0529   BUN 15 08/16/2014 0529   CREATININE 0.95 08/16/2014 0529   CALCIUM 8.1* 08/16/2014 0529   PROT 6.7 08/15/2014 1740   ALBUMIN 3.2* 08/15/2014 1740   AST 37 08/15/2014 1740   ALT 32 08/15/2014 1740   ALKPHOS 132*  08/15/2014 1740   BILITOT 1.8* 08/15/2014 1740   GFRNONAA 57* 08/16/2014 0529   GFRAA >60 08/16/2014 0529    GFR Estimated Creatinine Clearance: 52.7 mL/min (by C-G formula based on Cr of 0.95).  Recent Labs  08/15/14 1740  LIPASE 35    Recent Labs  08/15/14 2059  08/15/14 2308 08/16/14 0529 08/16/14 1340 08/16/14 1850  CKTOTAL 78  --  80  --   --   --   TROPONINI  --   < > 0.11* 0.08* 0.06* 0.06*  < > = values in this interval not displayed. Invalid input(s): POCBNP No results for input(s): DDIMER in the last 72 hours. No results for input(s): HGBA1C in the last 72 hours. No results for input(s): CHOL, HDL, LDLCALC, TRIG, CHOLHDL, LDLDIRECT in the last 72 hours. No results for input(s): TSH, T4TOTAL, T3FREE, THYROIDAB in the last 72 hours.  Invalid input(s): FREET3 No results for input(s): VITAMINB12, FOLATE, FERRITIN, TIBC, IRON, RETICCTPCT in the last 72 hours. Coags: No results for input(s): INR in the last 72 hours.  Invalid input(s): PT Microbiology: No results found for this or any previous visit (from the past 240 hour(s)).   BRIEF HOSPITAL COURSE:   Principal Problem: Syncope: Secondary to orthostatic hypotension as orthostatics vital signs were positive. She also had diarrhea for 1 week prior to admission. Patient was admitted and monitored in the telemetry unit, cardiology was also consulted. Echocardiogram revealed preserved ejection fraction. Cardiology has no further recommendations except for outpatient follow-up for a possible stress test. If discharge, patient was able to ambulated on on the bathroom and did not have any complaints. Her diarrhea had completely resolved.  Active Problems: Diarrhea: Resolved. Likely causing volume depletion and syncope. Unable to collect C. difficile PCR or GI pathogen panel as no stool sample was provided-as diarrhea resolved.  Hypokalemia: Secondary to diarrhea. Repleted. Potassium was normal at 3.5 on 6/24.  Mildly  elevated troponins: Not in a pattern of ACS. Cardiology was consulted and recommended no further evaluation at this time. Cardiology recommended maybe perhaps a outpatient stress test. Have asked patient and daughter to call and make an appointment.  Possible UTI: Patient denied any symptoms this M.D.-denied any dysuria, or frequency or suprapubic pain. She also denied fever. She was treated with Rocephin, she will be transitioned to Keflex for 2 more days to complete a 5 day course. Urine culture was pending at the time of discharge, please follow  Hypothyroidism: Continue with levothyroxine  Chronic pain syndrome: Continue chronic narcotics. Patient follows up with pain management M.D.  Rheumatoid arthritis: Continue with methotrexate  TODAY-DAY OF DISCHARGE:  Subjective:   Lauren Herrera today has no headache,no chest abdominal pain,no new weakness tingling or numbness, feels much better wants to go home today.  Objective:   Blood pressure 132/68, pulse 69,  temperature 98.9 F (37.2 C), temperature source Oral, resp. rate 18, height 5\' 9"  (1.753 m), weight 70.308 kg (155 lb), SpO2 100 %.  Intake/Output Summary (Last 24 hours) at 08/17/14 1030 Last data filed at 08/17/14 1017  Gross per 24 hour  Intake 1593.33 ml  Output      0 ml  Net 1593.33 ml   Filed Weights   08/15/14 1747  Weight: 70.308 kg (155 lb)    Exam Awake Alert, Oriented *3, No new F.N deficits, Normal affect [place].AT,PERRAL Supple Neck,No JVD, No cervical lymphadenopathy appriciated.  Symmetrical Chest wall movement, Good air movement bilaterally, CTAB RRR,No Gallops,Rubs or new Murmurs, No Parasternal Heave +ve B.Sounds, Abd Soft, Non tender, No organomegaly appriciated, No rebound -guarding or rigidity. No Cyanosis, Clubbing or edema, No new Rash or bruise  DISCHARGE CONDITION: Stable  DISPOSITION: Home  DISCHARGE INSTRUCTIONS:    Activity:  As tolerated  Diet recommendation: Heart Healthy  diet  Discharge Instructions    Call MD for:  persistant nausea and vomiting    Complete by:  As directed      Call MD for:  temperature >100.4    Complete by:  As directed      Diet - low sodium heart healthy    Complete by:  As directed      Increase activity slowly    Complete by:  As directed            Follow-up Information    Follow up with Reginia Naas, MD. Schedule an appointment as soon as possible for a visit in 3 days.   Specialty:  Family Medicine   Contact information:   [place] [address] [phone]       Follow up with Minus Breeding, MD. Schedule an appointment as soon as possible for a visit in 1 month.   Specialty:  Cardiology   Contact information:   [address] [place] [address] [phone]       Total Time spent on discharge equals 45 minutes.  SignedOren Binet 08/17/2014 10:30 AM

## 2014-08-17 NOTE — Progress Notes (Signed)
SUBJECTIVE: The patient is doing well today.  At this time, she denies chest pain, shortness of breath, or any new concerns.  Marland Kitchen aspirin  325 mg Oral Daily  . atorvastatin  10 mg Oral q1800  . cefTRIAXone (ROCEPHIN) IVPB 1 gram/50 mL D5W  1 g Intravenous Q24H  . enoxaparin (LOVENOX) injection  40 mg Subcutaneous QHS  . folic acid  8,676 mcg Oral Daily  . gabapentin  300 mg Oral BID  . levothyroxine  88 mcg Oral QAC breakfast  . morphine  60 mg Oral Q12H  . saccharomyces boulardii  250 mg Oral BID      OBJECTIVE: Physical Exam: Filed Vitals:   08/16/14 0752 08/16/14 1359 08/16/14 2136 08/17/14 0521  BP: 125/72 141/76 126/62 132/68  Pulse: 66 69 73 69  Temp: 97.4 F (36.3 C) 97.9 F (36.6 C) 98.8 F (37.1 C) 98.9 F (37.2 C)  TempSrc: Oral Axillary Oral Oral  Resp: 18 18 16 18   Height:      Weight:      SpO2: 95% 97% 100% 100%    Intake/Output Summary (Last 24 hours) at 08/17/14 0757 Last data filed at 08/17/14 0536  Gross per 24 hour  Intake 1113.33 ml  Output      0 ml  Net 1113.33 ml    Telemetry reveals sinus rhythm, no arrhythmias other than pacs  GEN- The patient is sleeping but rouses  Head- normocephalic, atraumatic Eyes-  Sclera clear, conjunctiva pink Ears- hearing intact Oropharynx- clear Neck- supple  Lungs- Clear to ausculation bilaterally, normal work of breathing Heart- Regular rate and rhythm, no murmurs, rubs or gallops, PMI not laterally displaced GI- soft, NT, ND, + BS Extremities- no clubbing, cyanosis, or edema Skin- no rash or lesion  LABS: Basic Metabolic Panel:  Recent Labs  08/15/14 2059 08/15/14 2308 08/16/14 0529  NA 139  --  140  K 3.2*  --  3.5  CL 101  --  104  CO2 29  --  28  GLUCOSE 112*  --  94  BUN 14  --  15  CREATININE 0.87 0.85 0.95  CALCIUM 8.8*  --  8.1*  MG 2.1  --   --    Liver Function Tests:  Recent Labs  08/15/14 1740  AST 37  ALT 32  ALKPHOS 132*  BILITOT 1.8*  PROT 6.7  ALBUMIN 3.2*     Recent Labs  08/15/14 1740  LIPASE 35   CBC:  Recent Labs  08/15/14 1553 08/15/14 2308 08/16/14 0529  WBC 14.0* 12.4* 9.0  NEUTROABS 12.3*  --   --   HGB 14.6 14.0 11.3*  HCT 41.9 40.8 33.8*  MCV 87.1 88.5 88.9  PLT 211 192 171   Cardiac Enzymes:  Recent Labs  08/15/14 2059  08/15/14 2308 08/16/14 0529 08/16/14 1340 08/16/14 1850  CKTOTAL 78  --  80  --   --   --   TROPONINI  --   < > 0.11* 0.08* 0.06* 0.06*  < > = values in this interval not displayed.   ASSESSMENT AND PLAN:  Principal Problem:   Syncope Active Problems:   Weakness   Hypokalemia   UTI (lower urinary tract infection)   Hypothyroidism   History of rheumatoid arthritis   Elevated troponin  1. Loss of consciousness Unclear etiology Echo is reviewed and is structurally normal ekg is low risk and telemetry reveals no arrhythmias I agree with Dr Percival Spanish that her event was most likely metabolic (  given diarrhea, hypokalemia, UTI) and unlikely cardiac in etiology No further workup planned  2. Minimally elevated troponin Does not represent MI No cardiac symptoms presently A plain old treadmill as an outpatient is not unreasonable  Follow-up in 6 weeks with Dr Percival Spanish or his NP/PA  Will as needed while here. Please call with questions  Thompson Grayer, MD 08/17/2014 7:57 AM

## 2014-08-19 ENCOUNTER — Telehealth: Payer: Self-pay | Admitting: Cardiology

## 2014-08-19 NOTE — Telephone Encounter (Signed)
Closed encounter °

## 2014-08-20 LAB — URINE CULTURE: Culture: >=100000

## 2014-10-17 ENCOUNTER — Ambulatory Visit: Payer: PPO | Admitting: Cardiology

## 2014-10-23 ENCOUNTER — Encounter: Payer: Self-pay | Admitting: Cardiology

## 2015-03-03 DIAGNOSIS — R1110 Vomiting, unspecified: Secondary | ICD-10-CM | POA: Diagnosis not present

## 2015-03-03 DIAGNOSIS — Z23 Encounter for immunization: Secondary | ICD-10-CM | POA: Diagnosis not present

## 2015-03-03 DIAGNOSIS — E039 Hypothyroidism, unspecified: Secondary | ICD-10-CM | POA: Diagnosis not present

## 2015-03-03 DIAGNOSIS — R109 Unspecified abdominal pain: Secondary | ICD-10-CM | POA: Diagnosis not present

## 2015-04-25 DIAGNOSIS — M791 Myalgia: Secondary | ICD-10-CM | POA: Diagnosis not present

## 2015-04-25 DIAGNOSIS — M47814 Spondylosis without myelopathy or radiculopathy, thoracic region: Secondary | ICD-10-CM | POA: Diagnosis not present

## 2015-04-25 DIAGNOSIS — M545 Low back pain: Secondary | ICD-10-CM | POA: Diagnosis not present

## 2015-04-25 DIAGNOSIS — E039 Hypothyroidism, unspecified: Secondary | ICD-10-CM | POA: Diagnosis not present

## 2015-04-25 DIAGNOSIS — M069 Rheumatoid arthritis, unspecified: Secondary | ICD-10-CM | POA: Diagnosis not present

## 2015-05-02 DIAGNOSIS — R5383 Other fatigue: Secondary | ICD-10-CM | POA: Diagnosis not present

## 2015-05-02 DIAGNOSIS — Z79899 Other long term (current) drug therapy: Secondary | ICD-10-CM | POA: Diagnosis not present

## 2015-05-22 DIAGNOSIS — M961 Postlaminectomy syndrome, not elsewhere classified: Secondary | ICD-10-CM | POA: Diagnosis not present

## 2015-05-22 DIAGNOSIS — Z79891 Long term (current) use of opiate analgesic: Secondary | ICD-10-CM | POA: Diagnosis not present

## 2015-05-22 DIAGNOSIS — M47816 Spondylosis without myelopathy or radiculopathy, lumbar region: Secondary | ICD-10-CM | POA: Diagnosis not present

## 2015-05-22 DIAGNOSIS — G894 Chronic pain syndrome: Secondary | ICD-10-CM | POA: Diagnosis not present

## 2015-06-25 DIAGNOSIS — M1990 Unspecified osteoarthritis, unspecified site: Secondary | ICD-10-CM | POA: Diagnosis not present

## 2015-06-25 DIAGNOSIS — M069 Rheumatoid arthritis, unspecified: Secondary | ICD-10-CM | POA: Diagnosis not present

## 2015-06-25 DIAGNOSIS — M545 Low back pain: Secondary | ICD-10-CM | POA: Diagnosis not present

## 2015-06-25 DIAGNOSIS — M797 Fibromyalgia: Secondary | ICD-10-CM | POA: Diagnosis not present

## 2015-09-25 DIAGNOSIS — M47816 Spondylosis without myelopathy or radiculopathy, lumbar region: Secondary | ICD-10-CM | POA: Diagnosis not present

## 2015-09-25 DIAGNOSIS — M1990 Unspecified osteoarthritis, unspecified site: Secondary | ICD-10-CM | POA: Diagnosis not present

## 2015-09-25 DIAGNOSIS — Z79891 Long term (current) use of opiate analgesic: Secondary | ICD-10-CM | POA: Diagnosis not present

## 2015-09-25 DIAGNOSIS — M069 Rheumatoid arthritis, unspecified: Secondary | ICD-10-CM | POA: Diagnosis not present

## 2015-09-25 DIAGNOSIS — G894 Chronic pain syndrome: Secondary | ICD-10-CM | POA: Diagnosis not present

## 2015-09-25 DIAGNOSIS — M961 Postlaminectomy syndrome, not elsewhere classified: Secondary | ICD-10-CM | POA: Diagnosis not present

## 2015-09-25 DIAGNOSIS — M545 Low back pain: Secondary | ICD-10-CM | POA: Diagnosis not present

## 2015-09-25 DIAGNOSIS — M797 Fibromyalgia: Secondary | ICD-10-CM | POA: Diagnosis not present

## 2015-12-29 DIAGNOSIS — M549 Dorsalgia, unspecified: Secondary | ICD-10-CM | POA: Diagnosis not present

## 2015-12-29 DIAGNOSIS — M545 Low back pain: Secondary | ICD-10-CM | POA: Diagnosis not present

## 2015-12-29 DIAGNOSIS — Z23 Encounter for immunization: Secondary | ICD-10-CM | POA: Diagnosis not present

## 2015-12-29 DIAGNOSIS — Z79899 Other long term (current) drug therapy: Secondary | ICD-10-CM | POA: Diagnosis not present

## 2015-12-29 DIAGNOSIS — M797 Fibromyalgia: Secondary | ICD-10-CM | POA: Diagnosis not present

## 2015-12-29 DIAGNOSIS — M1990 Unspecified osteoarthritis, unspecified site: Secondary | ICD-10-CM | POA: Diagnosis not present

## 2015-12-29 DIAGNOSIS — M5136 Other intervertebral disc degeneration, lumbar region: Secondary | ICD-10-CM | POA: Diagnosis not present

## 2015-12-29 DIAGNOSIS — M069 Rheumatoid arthritis, unspecified: Secondary | ICD-10-CM | POA: Diagnosis not present

## 2016-01-19 DIAGNOSIS — Z79891 Long term (current) use of opiate analgesic: Secondary | ICD-10-CM | POA: Diagnosis not present

## 2016-01-19 DIAGNOSIS — G894 Chronic pain syndrome: Secondary | ICD-10-CM | POA: Diagnosis not present

## 2016-01-19 DIAGNOSIS — M47816 Spondylosis without myelopathy or radiculopathy, lumbar region: Secondary | ICD-10-CM | POA: Diagnosis not present

## 2016-01-19 DIAGNOSIS — M961 Postlaminectomy syndrome, not elsewhere classified: Secondary | ICD-10-CM | POA: Diagnosis not present

## 2016-01-28 ENCOUNTER — Ambulatory Visit (INDEPENDENT_AMBULATORY_CARE_PROVIDER_SITE_OTHER): Payer: Self-pay | Admitting: Orthopaedic Surgery

## 2016-01-29 DIAGNOSIS — E7800 Pure hypercholesterolemia, unspecified: Secondary | ICD-10-CM | POA: Diagnosis not present

## 2016-01-29 DIAGNOSIS — E039 Hypothyroidism, unspecified: Secondary | ICD-10-CM | POA: Diagnosis not present

## 2016-01-29 DIAGNOSIS — I10 Essential (primary) hypertension: Secondary | ICD-10-CM | POA: Diagnosis not present

## 2016-01-29 DIAGNOSIS — S139XXA Sprain of joints and ligaments of unspecified parts of neck, initial encounter: Secondary | ICD-10-CM | POA: Diagnosis not present

## 2016-02-09 DIAGNOSIS — D72829 Elevated white blood cell count, unspecified: Secondary | ICD-10-CM | POA: Diagnosis not present

## 2016-02-09 DIAGNOSIS — R748 Abnormal levels of other serum enzymes: Secondary | ICD-10-CM | POA: Diagnosis not present

## 2016-02-24 ENCOUNTER — Ambulatory Visit (INDEPENDENT_AMBULATORY_CARE_PROVIDER_SITE_OTHER): Payer: Self-pay | Admitting: Orthopaedic Surgery

## 2016-04-19 DIAGNOSIS — G894 Chronic pain syndrome: Secondary | ICD-10-CM | POA: Diagnosis not present

## 2016-04-19 DIAGNOSIS — Z79891 Long term (current) use of opiate analgesic: Secondary | ICD-10-CM | POA: Diagnosis not present

## 2016-04-19 DIAGNOSIS — M961 Postlaminectomy syndrome, not elsewhere classified: Secondary | ICD-10-CM | POA: Diagnosis not present

## 2016-04-19 DIAGNOSIS — M47816 Spondylosis without myelopathy or radiculopathy, lumbar region: Secondary | ICD-10-CM | POA: Diagnosis not present

## 2016-07-05 DIAGNOSIS — M62838 Other muscle spasm: Secondary | ICD-10-CM | POA: Diagnosis not present

## 2016-07-05 DIAGNOSIS — K047 Periapical abscess without sinus: Secondary | ICD-10-CM | POA: Diagnosis not present

## 2016-07-14 DIAGNOSIS — M47816 Spondylosis without myelopathy or radiculopathy, lumbar region: Secondary | ICD-10-CM | POA: Diagnosis not present

## 2016-07-14 DIAGNOSIS — Z79891 Long term (current) use of opiate analgesic: Secondary | ICD-10-CM | POA: Diagnosis not present

## 2016-07-14 DIAGNOSIS — M961 Postlaminectomy syndrome, not elsewhere classified: Secondary | ICD-10-CM | POA: Diagnosis not present

## 2016-07-14 DIAGNOSIS — G894 Chronic pain syndrome: Secondary | ICD-10-CM | POA: Diagnosis not present

## 2016-07-29 DIAGNOSIS — F321 Major depressive disorder, single episode, moderate: Secondary | ICD-10-CM | POA: Diagnosis not present

## 2016-07-29 DIAGNOSIS — E039 Hypothyroidism, unspecified: Secondary | ICD-10-CM | POA: Diagnosis not present

## 2016-07-29 DIAGNOSIS — G609 Hereditary and idiopathic neuropathy, unspecified: Secondary | ICD-10-CM | POA: Diagnosis not present

## 2016-07-29 DIAGNOSIS — D72828 Other elevated white blood cell count: Secondary | ICD-10-CM | POA: Diagnosis not present

## 2016-07-29 DIAGNOSIS — M069 Rheumatoid arthritis, unspecified: Secondary | ICD-10-CM | POA: Diagnosis not present

## 2016-07-29 DIAGNOSIS — E7800 Pure hypercholesterolemia, unspecified: Secondary | ICD-10-CM | POA: Diagnosis not present

## 2016-07-29 DIAGNOSIS — I10 Essential (primary) hypertension: Secondary | ICD-10-CM | POA: Diagnosis not present

## 2016-09-27 DIAGNOSIS — G894 Chronic pain syndrome: Secondary | ICD-10-CM | POA: Diagnosis not present

## 2016-09-27 DIAGNOSIS — M47816 Spondylosis without myelopathy or radiculopathy, lumbar region: Secondary | ICD-10-CM | POA: Diagnosis not present

## 2016-09-27 DIAGNOSIS — M961 Postlaminectomy syndrome, not elsewhere classified: Secondary | ICD-10-CM | POA: Diagnosis not present

## 2016-09-27 DIAGNOSIS — Z79891 Long term (current) use of opiate analgesic: Secondary | ICD-10-CM | POA: Diagnosis not present

## 2016-11-22 DIAGNOSIS — M0580 Other rheumatoid arthritis with rheumatoid factor of unspecified site: Secondary | ICD-10-CM | POA: Diagnosis not present

## 2016-11-22 DIAGNOSIS — M47814 Spondylosis without myelopathy or radiculopathy, thoracic region: Secondary | ICD-10-CM | POA: Diagnosis not present

## 2016-11-22 DIAGNOSIS — Z79891 Long term (current) use of opiate analgesic: Secondary | ICD-10-CM | POA: Diagnosis not present

## 2016-11-22 DIAGNOSIS — G894 Chronic pain syndrome: Secondary | ICD-10-CM | POA: Diagnosis not present

## 2016-11-22 DIAGNOSIS — G603 Idiopathic progressive neuropathy: Secondary | ICD-10-CM | POA: Diagnosis not present

## 2016-11-22 DIAGNOSIS — M47816 Spondylosis without myelopathy or radiculopathy, lumbar region: Secondary | ICD-10-CM | POA: Diagnosis not present

## 2016-11-22 DIAGNOSIS — M961 Postlaminectomy syndrome, not elsewhere classified: Secondary | ICD-10-CM | POA: Diagnosis not present

## 2016-11-22 DIAGNOSIS — M47812 Spondylosis without myelopathy or radiculopathy, cervical region: Secondary | ICD-10-CM | POA: Diagnosis not present

## 2017-01-20 DIAGNOSIS — M47816 Spondylosis without myelopathy or radiculopathy, lumbar region: Secondary | ICD-10-CM | POA: Diagnosis not present

## 2017-01-20 DIAGNOSIS — G894 Chronic pain syndrome: Secondary | ICD-10-CM | POA: Diagnosis not present

## 2017-01-20 DIAGNOSIS — M961 Postlaminectomy syndrome, not elsewhere classified: Secondary | ICD-10-CM | POA: Diagnosis not present

## 2017-01-20 DIAGNOSIS — Z79891 Long term (current) use of opiate analgesic: Secondary | ICD-10-CM | POA: Diagnosis not present

## 2017-02-16 DIAGNOSIS — Z79891 Long term (current) use of opiate analgesic: Secondary | ICD-10-CM | POA: Diagnosis not present

## 2017-02-16 DIAGNOSIS — G894 Chronic pain syndrome: Secondary | ICD-10-CM | POA: Diagnosis not present

## 2017-02-16 DIAGNOSIS — M47816 Spondylosis without myelopathy or radiculopathy, lumbar region: Secondary | ICD-10-CM | POA: Diagnosis not present

## 2017-02-16 DIAGNOSIS — M961 Postlaminectomy syndrome, not elsewhere classified: Secondary | ICD-10-CM | POA: Diagnosis not present

## 2017-02-23 DIAGNOSIS — Z23 Encounter for immunization: Secondary | ICD-10-CM | POA: Diagnosis not present

## 2017-04-18 DIAGNOSIS — Z79891 Long term (current) use of opiate analgesic: Secondary | ICD-10-CM | POA: Diagnosis not present

## 2017-04-18 DIAGNOSIS — M47816 Spondylosis without myelopathy or radiculopathy, lumbar region: Secondary | ICD-10-CM | POA: Diagnosis not present

## 2017-04-18 DIAGNOSIS — M961 Postlaminectomy syndrome, not elsewhere classified: Secondary | ICD-10-CM | POA: Diagnosis not present

## 2017-04-18 DIAGNOSIS — G894 Chronic pain syndrome: Secondary | ICD-10-CM | POA: Diagnosis not present

## 2017-06-13 DIAGNOSIS — M961 Postlaminectomy syndrome, not elsewhere classified: Secondary | ICD-10-CM | POA: Diagnosis not present

## 2017-06-13 DIAGNOSIS — G894 Chronic pain syndrome: Secondary | ICD-10-CM | POA: Diagnosis not present

## 2017-06-13 DIAGNOSIS — Z79891 Long term (current) use of opiate analgesic: Secondary | ICD-10-CM | POA: Diagnosis not present

## 2017-06-13 DIAGNOSIS — M47816 Spondylosis without myelopathy or radiculopathy, lumbar region: Secondary | ICD-10-CM | POA: Diagnosis not present

## 2017-07-12 ENCOUNTER — Encounter (HOSPITAL_BASED_OUTPATIENT_CLINIC_OR_DEPARTMENT_OTHER): Payer: Self-pay | Admitting: *Deleted

## 2017-07-12 ENCOUNTER — Other Ambulatory Visit: Payer: Self-pay

## 2017-07-12 ENCOUNTER — Emergency Department (HOSPITAL_BASED_OUTPATIENT_CLINIC_OR_DEPARTMENT_OTHER)
Admission: EM | Admit: 2017-07-12 | Discharge: 2017-07-12 | Disposition: A | Discharge status: Home / Self Care | Payer: PPO | Attending: Emergency Medicine | Admitting: Emergency Medicine

## 2017-07-12 ENCOUNTER — Emergency Department (HOSPITAL_BASED_OUTPATIENT_CLINIC_OR_DEPARTMENT_OTHER): Payer: PPO

## 2017-07-12 DIAGNOSIS — W228XXA Striking against or struck by other objects, initial encounter: Secondary | ICD-10-CM | POA: Diagnosis not present

## 2017-07-12 DIAGNOSIS — Y929 Unspecified place or not applicable: Secondary | ICD-10-CM | POA: Insufficient documentation

## 2017-07-12 DIAGNOSIS — S52231A Displaced oblique fracture of shaft of right ulna, initial encounter for closed fracture: Secondary | ICD-10-CM | POA: Insufficient documentation

## 2017-07-12 DIAGNOSIS — Z79899 Other long term (current) drug therapy: Secondary | ICD-10-CM | POA: Diagnosis not present

## 2017-07-12 DIAGNOSIS — Y999 Unspecified external cause status: Secondary | ICD-10-CM | POA: Insufficient documentation

## 2017-07-12 DIAGNOSIS — S6991XA Unspecified injury of right wrist, hand and finger(s), initial encounter: Secondary | ICD-10-CM | POA: Diagnosis present

## 2017-07-12 DIAGNOSIS — Z7982 Long term (current) use of aspirin: Secondary | ICD-10-CM | POA: Insufficient documentation

## 2017-07-12 DIAGNOSIS — Y939 Activity, unspecified: Secondary | ICD-10-CM | POA: Insufficient documentation

## 2017-07-12 DIAGNOSIS — E039 Hypothyroidism, unspecified: Secondary | ICD-10-CM | POA: Diagnosis not present

## 2017-07-12 DIAGNOSIS — S52611A Displaced fracture of right ulna styloid process, initial encounter for closed fracture: Secondary | ICD-10-CM | POA: Diagnosis not present

## 2017-07-12 MED ORDER — HYDROCODONE-ACETAMINOPHEN 5-325 MG PO TABS: 1.0000 | ORAL_TABLET | Freq: Four times a day (QID) | ORAL | 0 refills | Status: AC | PRN

## 2017-07-12 MED FILL — HYDROCODON-APAP 5-325: 5-325 | 3 days supply | Qty: 10 | Fill #0

## 2017-07-12 NOTE — Discharge Instructions (Addendum)
You have been seen today for a forearm injury. There was evidence of a fracture to one of the bones of the forearm. There was an additional finding of osteopenia on the x-ray, which means you have weakened bones. Pain: May take your already prescribed pain medication.  May add in Tylenol for additional pain relief.   Tylenol: Should you continue to have additional pain while taking the morphine, you may add in tylenol as needed. Your daily total maximum amount of tylenol from all sources should be limited to 4000mg /day for persons without liver problems, or 2000mg /day for those with liver problems. Vicodin: May take Vicodin as needed for severe pain.  Do not drive or perform other dangerous activities while taking the Vicodin.  Please note that each pill of Vicodin contains 325 mg of Tylenol and the above dosage limits apply. Be sure to make your pain management specialist aware you have been prescribed additional opiate pain medications.   Ice: May apply ice to the area over the next 24 hours for 15 minutes at a time to reduce swelling. Elevation: Keep the extremity elevated as often as possible to reduce pain and inflammation. Support: Wear the sling for support and comfort.  Keep the splint clean and dry.  Follow up: Follow-up with the hand specialist as soon as possible on this matter.  Call the number provided to set up an appointment. Your blood pressure was noted to be much higher than normal.  Please be sure to keep your appointment with your primary care provider next week and discuss this matter.

## 2017-07-12 NOTE — ED Provider Notes (Signed)
[facility]PART[facility] Provider Note   CSN: [id number] Arrival date & time: 07/12/17  1148     History   Chief Complaint Chief Complaint  Patient presents with  . Wrist Injury    HPI Lauren Herrera is a 82 y.o. female.  HPI   Lauren Herrera is a 82 y.o. female, with a history of osteoarthritis, presenting to the ED with an injury to the right wrist that occurred yesterday.  States she accidentally hit her forearm on a metal table.  Pain is moderate to severe, throbbing, radiating distally and proximally.  She has taken her regularly prescribed morphine as well as Tylenol for the pain. She is up to date on tetanus. Denies numbness, weakness, or any other complaints.  Past Medical History:  Diagnosis Date  . Arthritis   . Chronic pain   . Osteoarthritis   . Thyroid disease     Patient Active Problem List   Diagnosis Date Noted  . Elevated troponin 08/16/2014  . Faintness   . Weakness 08/15/2014  . Syncope 08/15/2014  . Hypokalemia 08/15/2014  . UTI (lower urinary tract infection) 08/15/2014  . Hypothyroidism 08/15/2014  . History of rheumatoid arthritis 08/15/2014    Past Surgical History:  Procedure Laterality Date  . ABDOMINAL HYSTERECTOMY    . CERVICAL FUSION    . CHOLECYSTECTOMY       OB History   None      Home Medications    Prior to Admission medications   Medication Sig Start Date End Date Taking? Authorizing Provider  aspirin 81 MG tablet Take 1 tablet (81 mg total) by mouth daily. 08/17/14   Ghimire, Henreitta Leber, MD  atorvastatin (LIPITOR) 10 MG tablet Take 10 mg by mouth daily.    [provider]  cephALEXin (KEFLEX) 500 MG capsule Take 1 capsule (500 mg total) by mouth 3 (three) times daily. 08/17/14   Ghimire, Henreitta Leber, MD  folic acid (FOLVITE) 101 MCG tablet Take 400 mcg by mouth daily.    [provider]  gabapentin (NEURONTIN) 100 MG capsule Take 300 mg by mouth 2 (two) times daily.    [provider]  levothyroxine (SYNTHROID, LEVOTHROID) 88 MCG tablet Take 88 mcg by mouth daily.    [provider]  methotrexate (RHEUMATREX) 10 MG tablet Take 10 mg by mouth once a week. Caution: Chemotherapy. Protect from light.    [provider]  morphine (KADIAN) 60 MG 24 hr capsule Take 60 mg by mouth 2 (two) times daily.    [provider]  saccharomyces boulardii (FLORASTOR) 250 MG capsule Take 1 capsule (250 mg total) by mouth 2 (two) times daily. 08/17/14   Ghimire, Henreitta Leber, MD    Family History Family History  Problem Relation Age of Onset  . Heart disease Mother 52       had CABG  . Heart disease Brother        stents  . CAD Neg Hx   . Stroke Neg Hx     Social History Social History   Tobacco Use  . Smoking status: Never Smoker  . Smokeless tobacco: Never Used  Substance Use Topics  . Alcohol use: No  . Drug use: No     Allergies   Codeine and Latex   Review of Systems Review of Systems  Musculoskeletal: Positive for arthralgias.  Skin: Positive for wound.  Neurological: Negative for weakness and numbness.     Physical Exam Updated Vital Signs  BP (!) 189/88 (BP Location: Left Arm)   Pulse 68   Temp 97.9 F (36.6 C) (Oral)   Resp 18   Ht 5' 9.5" (1.765 m)   Wt 65.8 kg (145 lb)   SpO2 96%   BMI 21.11 kg/m   Physical Exam  Constitutional: She is oriented to person, place, and time. She appears well-developed and well-nourished. No distress.  HENT:  Head: Normocephalic and atraumatic.  Eyes: Conjunctivae are normal.  Neck: Neck supple.  Cardiovascular: Normal rate, regular rhythm and intact distal pulses.  Pulmonary/Chest: Effort normal. No respiratory distress.  Abdominal: There is no guarding.  Musculoskeletal: She exhibits edema and tenderness.  Tenderness and edema to the right distal ulna.  There is a small abrasion noted nearby.  This point he does not  Neurological: She is alert and oriented to person, place,  and time.  No sensory deficits noted in the bilateral upper extremities. Abduction and adduction of the fingers intact against resistance. Grip strength equal bilaterally. Motor function intact in the right wrist. Strength 5/5 with flexion and extension of the bilateral elbows. Patient can touch the thumb to each one of the fingertips without difficulty.    Skin: Skin is warm and dry. Capillary refill takes less than 2 seconds. She is not diaphoretic. No pallor.  Psychiatric: She has a normal mood and affect. Her behavior is normal.  Nursing note and vitals reviewed.    ED Treatments / Results  Labs (all labs ordered are listed, but only abnormal results are displayed) Labs Reviewed - No data to display  EKG None  Radiology Dg Wrist Complete Right  Result Date: 07/12/2017 CLINICAL DATA:  Injury, wrist pain EXAM: RIGHT WRIST - COMPLETE 3+ VIEW COMPARISON:  None. FINDINGS: Mildly displaced fracture distal ulna approximately 2 cm proximal to the wrist joint. Osteopenia. Chondrocalcinosis in the triangular fibrocartilage. Moderate degenerative change of the base of the thumb. No other fracture IMPRESSION: Mildly displaced fracture distal ulna. Electronically Signed   By: Franchot Gallo M.D.   On: 07/12/2017 12:16    Procedures .Splint Application Date/Time: 10/10/2991 2:10 PM Performed by: Lorayne Bender, PA-C Authorized by: Lorayne Bender, PA-C   Consent:    Consent obtained:  Verbal   Consent given by:  Patient   Alternatives discussed:  No treatment Pre-procedure details:    Sensation:  Normal   Skin color:  Normal Procedure details:    Laterality:  Right   Location:  Arm   Arm:  R lower arm   Splint type:  Ulnar gutter Post-procedure details:    Pain:  Improved   Sensation:  Normal   Skin color:  Normal   Patient tolerance of procedure:  Tolerated well, no immediate complications Comments:     Procedure was performed by the Med Tech with my evaluation before and after.  I was available for consultation throughout the procedure.   (including critical care time)  Medications Ordered in ED Medications - No data to display   Initial Impression / Assessment and Plan / ED Course  I have reviewed the triage vital signs and the nursing notes.  Pertinent labs & imaging results that were available during my care of the patient were reviewed by me and considered in my medical decision making (see chart for details).      Patient presents with a right forearm injury.  Neurovascularly intact.  Mildly displaced distal ulnar fracture noted on x-ray.  Patient is followed by a pain management clinic for  chronic back pain, however, her acute pain due to the wrist fracture has not been lessened by her home pain medications. Since this is acute pain from an acute injury, additional narcotic analgesics are permitted. She has been instructed to inform her pain management specialist of the additional short course of narcotics prescribed today. She will follow-up with the hand specialist.  She has been seen by Raliegh Ip in the past and would like to stay with this practice.  The patient was given instructions for home care as well as return precautions. Patient voices understanding of these instructions, accepts the plan, and is comfortable with discharge.   Findings and plan of care discussed with Orlie Dakin, MD. Dr. Winfred Leeds personally evaluated and examined this patient.   Vitals:   07/12/17 1155 07/12/17 1157 07/12/17 1314  BP:  (!) 201/99 (!) 189/88  Pulse:  74 68  Resp:  16 18  Temp:  97.9 F (36.6 C)   TempSrc:  Oral   SpO2:  97% 96%  Weight: 65.8 kg (145 lb)    Height: 5' 9.5" (1.765 m)     Patient's hypertension is noted.  She is asymptomatic to this at this time.  This was discussed with the patient.  She states she has noted that her blood pressure has been high for about the last year.  She has an appointment with her PCP in a week to discuss this  matter.  Final Clinical Impressions(s) / ED Diagnoses   Final diagnoses:  Closed displaced oblique fracture of shaft of right ulna, initial encounter    ED Discharge Orders    None       Layla Maw 07/12/17 [place], MD 07/12/17 1544

## 2017-07-12 NOTE — ED Triage Notes (Signed)
She hit her right wrist against a metal table yesterday. Pain and skin abrasion noted.

## 2017-07-12 NOTE — ED Provider Notes (Signed)
Patient hit her right wrist on the top of the table last night complains of right wrist pain, ulnar aspect no other complaint. No treatment prior to coming here. No other associated symptoms. No other injury. X-rays viewed by.   Orlie Dakin, MD 07/12/17 [phone]

## 2017-07-14 DIAGNOSIS — M25521 Pain in right elbow: Secondary | ICD-10-CM | POA: Diagnosis not present

## 2017-07-19 DIAGNOSIS — E039 Hypothyroidism, unspecified: Secondary | ICD-10-CM | POA: Diagnosis not present

## 2017-07-19 DIAGNOSIS — I10 Essential (primary) hypertension: Secondary | ICD-10-CM | POA: Diagnosis not present

## 2017-07-21 DIAGNOSIS — S52501A Unspecified fracture of the lower end of right radius, initial encounter for closed fracture: Secondary | ICD-10-CM | POA: Diagnosis not present

## 2017-08-11 DIAGNOSIS — M961 Postlaminectomy syndrome, not elsewhere classified: Secondary | ICD-10-CM | POA: Diagnosis not present

## 2017-08-11 DIAGNOSIS — Z79891 Long term (current) use of opiate analgesic: Secondary | ICD-10-CM | POA: Diagnosis not present

## 2017-08-11 DIAGNOSIS — G894 Chronic pain syndrome: Secondary | ICD-10-CM | POA: Diagnosis not present

## 2017-08-11 DIAGNOSIS — M47816 Spondylosis without myelopathy or radiculopathy, lumbar region: Secondary | ICD-10-CM | POA: Diagnosis not present

## 2017-08-11 DIAGNOSIS — I10 Essential (primary) hypertension: Secondary | ICD-10-CM | POA: Diagnosis not present

## 2017-08-18 DIAGNOSIS — S52501D Unspecified fracture of the lower end of right radius, subsequent encounter for closed fracture with routine healing: Secondary | ICD-10-CM | POA: Diagnosis not present

## 2017-08-18 DIAGNOSIS — M25511 Pain in right shoulder: Secondary | ICD-10-CM | POA: Diagnosis not present

## 2017-09-15 DIAGNOSIS — S52501D Unspecified fracture of the lower end of right radius, subsequent encounter for closed fracture with routine healing: Secondary | ICD-10-CM | POA: Diagnosis not present

## 2017-11-02 DIAGNOSIS — M961 Postlaminectomy syndrome, not elsewhere classified: Secondary | ICD-10-CM | POA: Diagnosis not present

## 2017-11-02 DIAGNOSIS — M47816 Spondylosis without myelopathy or radiculopathy, lumbar region: Secondary | ICD-10-CM | POA: Diagnosis not present

## 2017-11-02 DIAGNOSIS — G894 Chronic pain syndrome: Secondary | ICD-10-CM | POA: Diagnosis not present

## 2017-11-02 DIAGNOSIS — Z79891 Long term (current) use of opiate analgesic: Secondary | ICD-10-CM | POA: Diagnosis not present

## 2017-12-05 DIAGNOSIS — I10 Essential (primary) hypertension: Secondary | ICD-10-CM | POA: Diagnosis not present

## 2017-12-05 DIAGNOSIS — M069 Rheumatoid arthritis, unspecified: Secondary | ICD-10-CM | POA: Diagnosis not present

## 2017-12-05 DIAGNOSIS — M8588 Other specified disorders of bone density and structure, other site: Secondary | ICD-10-CM | POA: Diagnosis not present

## 2017-12-05 DIAGNOSIS — E7800 Pure hypercholesterolemia, unspecified: Secondary | ICD-10-CM | POA: Diagnosis not present

## 2017-12-05 DIAGNOSIS — K219 Gastro-esophageal reflux disease without esophagitis: Secondary | ICD-10-CM | POA: Diagnosis not present

## 2017-12-05 DIAGNOSIS — Z1389 Encounter for screening for other disorder: Secondary | ICD-10-CM | POA: Diagnosis not present

## 2017-12-05 DIAGNOSIS — E039 Hypothyroidism, unspecified: Secondary | ICD-10-CM | POA: Diagnosis not present

## 2017-12-05 DIAGNOSIS — Z0000 Encounter for general adult medical examination without abnormal findings: Secondary | ICD-10-CM | POA: Diagnosis not present

## 2017-12-27 DIAGNOSIS — D045 Carcinoma in situ of skin of trunk: Secondary | ICD-10-CM | POA: Diagnosis not present

## 2017-12-27 DIAGNOSIS — L570 Actinic keratosis: Secondary | ICD-10-CM | POA: Diagnosis not present

## 2017-12-27 DIAGNOSIS — X32XXXA Exposure to sunlight, initial encounter: Secondary | ICD-10-CM | POA: Diagnosis not present

## 2017-12-28 DIAGNOSIS — Z79891 Long term (current) use of opiate analgesic: Secondary | ICD-10-CM | POA: Diagnosis not present

## 2017-12-28 DIAGNOSIS — G894 Chronic pain syndrome: Secondary | ICD-10-CM | POA: Diagnosis not present

## 2017-12-28 DIAGNOSIS — M961 Postlaminectomy syndrome, not elsewhere classified: Secondary | ICD-10-CM | POA: Diagnosis not present

## 2017-12-28 DIAGNOSIS — M47816 Spondylosis without myelopathy or radiculopathy, lumbar region: Secondary | ICD-10-CM | POA: Diagnosis not present

## 2018-01-12 DIAGNOSIS — Z1231 Encounter for screening mammogram for malignant neoplasm of breast: Secondary | ICD-10-CM | POA: Diagnosis not present

## 2018-01-12 DIAGNOSIS — Z90710 Acquired absence of both cervix and uterus: Secondary | ICD-10-CM | POA: Diagnosis not present

## 2018-01-12 DIAGNOSIS — M8589 Other specified disorders of bone density and structure, multiple sites: Secondary | ICD-10-CM | POA: Diagnosis not present

## 2018-01-12 DIAGNOSIS — Z780 Asymptomatic menopausal state: Secondary | ICD-10-CM | POA: Diagnosis not present

## 2018-01-17 DIAGNOSIS — S52501D Unspecified fracture of the lower end of right radius, subsequent encounter for closed fracture with routine healing: Secondary | ICD-10-CM | POA: Diagnosis not present

## 2018-01-24 DIAGNOSIS — D045 Carcinoma in situ of skin of trunk: Secondary | ICD-10-CM | POA: Diagnosis not present

## 2018-04-17 DIAGNOSIS — I10 Essential (primary) hypertension: Secondary | ICD-10-CM | POA: Diagnosis not present

## 2018-04-26 DIAGNOSIS — K5730 Diverticulosis of large intestine without perforation or abscess without bleeding: Secondary | ICD-10-CM | POA: Diagnosis not present

## 2018-04-26 DIAGNOSIS — Z86010 Personal history of colonic polyps: Secondary | ICD-10-CM | POA: Diagnosis not present

## 2018-05-16 DIAGNOSIS — Z85828 Personal history of other malignant neoplasm of skin: Secondary | ICD-10-CM | POA: Diagnosis not present

## 2018-05-16 DIAGNOSIS — Z08 Encounter for follow-up examination after completed treatment for malignant neoplasm: Secondary | ICD-10-CM | POA: Diagnosis not present

## 2018-05-16 DIAGNOSIS — C44311 Basal cell carcinoma of skin of nose: Secondary | ICD-10-CM | POA: Diagnosis not present

## 2018-05-17 DIAGNOSIS — Z79891 Long term (current) use of opiate analgesic: Secondary | ICD-10-CM | POA: Diagnosis not present

## 2018-05-17 DIAGNOSIS — G894 Chronic pain syndrome: Secondary | ICD-10-CM | POA: Diagnosis not present

## 2018-05-17 DIAGNOSIS — M47816 Spondylosis without myelopathy or radiculopathy, lumbar region: Secondary | ICD-10-CM | POA: Diagnosis not present

## 2018-05-17 DIAGNOSIS — M961 Postlaminectomy syndrome, not elsewhere classified: Secondary | ICD-10-CM | POA: Diagnosis not present

## 2018-06-06 DIAGNOSIS — I10 Essential (primary) hypertension: Secondary | ICD-10-CM | POA: Diagnosis not present

## 2018-06-06 DIAGNOSIS — E039 Hypothyroidism, unspecified: Secondary | ICD-10-CM | POA: Diagnosis not present

## 2018-06-06 DIAGNOSIS — M069 Rheumatoid arthritis, unspecified: Secondary | ICD-10-CM | POA: Diagnosis not present

## 2018-06-06 DIAGNOSIS — E7800 Pure hypercholesterolemia, unspecified: Secondary | ICD-10-CM | POA: Diagnosis not present

## 2018-07-12 DIAGNOSIS — M961 Postlaminectomy syndrome, not elsewhere classified: Secondary | ICD-10-CM | POA: Diagnosis not present

## 2018-07-12 DIAGNOSIS — Z79891 Long term (current) use of opiate analgesic: Secondary | ICD-10-CM | POA: Diagnosis not present

## 2018-07-12 DIAGNOSIS — M47816 Spondylosis without myelopathy or radiculopathy, lumbar region: Secondary | ICD-10-CM | POA: Diagnosis not present

## 2018-07-12 DIAGNOSIS — G894 Chronic pain syndrome: Secondary | ICD-10-CM | POA: Diagnosis not present

## 2018-09-06 DIAGNOSIS — G894 Chronic pain syndrome: Secondary | ICD-10-CM | POA: Diagnosis not present

## 2018-09-06 DIAGNOSIS — Z79891 Long term (current) use of opiate analgesic: Secondary | ICD-10-CM | POA: Diagnosis not present

## 2018-09-06 DIAGNOSIS — M47816 Spondylosis without myelopathy or radiculopathy, lumbar region: Secondary | ICD-10-CM | POA: Diagnosis not present

## 2018-09-06 DIAGNOSIS — M961 Postlaminectomy syndrome, not elsewhere classified: Secondary | ICD-10-CM | POA: Diagnosis not present

## 2018-10-18 DIAGNOSIS — Z23 Encounter for immunization: Secondary | ICD-10-CM | POA: Diagnosis not present

## 2018-11-01 DIAGNOSIS — M961 Postlaminectomy syndrome, not elsewhere classified: Secondary | ICD-10-CM | POA: Diagnosis not present

## 2018-11-01 DIAGNOSIS — Z79891 Long term (current) use of opiate analgesic: Secondary | ICD-10-CM | POA: Diagnosis not present

## 2018-11-01 DIAGNOSIS — M47816 Spondylosis without myelopathy or radiculopathy, lumbar region: Secondary | ICD-10-CM | POA: Diagnosis not present

## 2018-11-01 DIAGNOSIS — G894 Chronic pain syndrome: Secondary | ICD-10-CM | POA: Diagnosis not present

## 2018-11-20 ENCOUNTER — Other Ambulatory Visit: Payer: Self-pay | Admitting: *Deleted

## 2018-11-20 DIAGNOSIS — Z20822 Contact with and (suspected) exposure to covid-19: Secondary | ICD-10-CM

## 2018-11-21 LAB — NOVEL CORONAVIRUS, NAA: SARS-CoV-2, NAA: NOT DETECTED

## 2018-12-27 DIAGNOSIS — G894 Chronic pain syndrome: Secondary | ICD-10-CM | POA: Diagnosis not present

## 2018-12-27 DIAGNOSIS — Z79891 Long term (current) use of opiate analgesic: Secondary | ICD-10-CM | POA: Diagnosis not present

## 2018-12-27 DIAGNOSIS — M961 Postlaminectomy syndrome, not elsewhere classified: Secondary | ICD-10-CM | POA: Diagnosis not present

## 2018-12-27 DIAGNOSIS — M47816 Spondylosis without myelopathy or radiculopathy, lumbar region: Secondary | ICD-10-CM | POA: Diagnosis not present

## 2019-02-08 DIAGNOSIS — I10 Essential (primary) hypertension: Secondary | ICD-10-CM | POA: Diagnosis not present

## 2019-02-08 DIAGNOSIS — E039 Hypothyroidism, unspecified: Secondary | ICD-10-CM | POA: Diagnosis not present

## 2019-02-08 DIAGNOSIS — E7800 Pure hypercholesterolemia, unspecified: Secondary | ICD-10-CM | POA: Diagnosis not present

## 2019-02-08 DIAGNOSIS — M069 Rheumatoid arthritis, unspecified: Secondary | ICD-10-CM | POA: Diagnosis not present

## 2019-03-21 DIAGNOSIS — M961 Postlaminectomy syndrome, not elsewhere classified: Secondary | ICD-10-CM | POA: Diagnosis not present

## 2019-03-21 DIAGNOSIS — E039 Hypothyroidism, unspecified: Secondary | ICD-10-CM | POA: Diagnosis not present

## 2019-03-21 DIAGNOSIS — Z79891 Long term (current) use of opiate analgesic: Secondary | ICD-10-CM | POA: Diagnosis not present

## 2019-03-21 DIAGNOSIS — E7800 Pure hypercholesterolemia, unspecified: Secondary | ICD-10-CM | POA: Diagnosis not present

## 2019-03-21 DIAGNOSIS — G894 Chronic pain syndrome: Secondary | ICD-10-CM | POA: Diagnosis not present

## 2019-03-21 DIAGNOSIS — I10 Essential (primary) hypertension: Secondary | ICD-10-CM | POA: Diagnosis not present

## 2019-03-21 DIAGNOSIS — M0580 Other rheumatoid arthritis with rheumatoid factor of unspecified site: Secondary | ICD-10-CM | POA: Diagnosis not present

## 2019-04-07 ENCOUNTER — Ambulatory Visit: Payer: PPO | Attending: Internal Medicine

## 2019-04-07 DIAGNOSIS — Z23 Encounter for immunization: Secondary | ICD-10-CM | POA: Insufficient documentation

## 2019-04-07 NOTE — Progress Notes (Signed)
   Covid-19 Vaccination Clinic  Name:  Lauren Herrera    MRN: [id number] DOB: September 29, 1935  04/07/2019  [name] was observed post Covid-19 immunization for 15 minutes without incidence. She was provided with Vaccine Information Sheet and instruction to access the V-Safe system.   [name] was instructed to call 911 with any severe reactions post vaccine: Marland Kitchen Difficulty breathing  . Swelling of your face and throat  . A fast heartbeat  . A bad rash all over your body  . Dizziness and weakness    Immunizations Administered    Name Date Dose VIS Date Route   Pfizer COVID-19 Vaccine 04/07/2019  2:47 PM 0.3 mL 02/02/2019 Intramuscular   Manufacturer: [place]   Lot: [id number]   [place]: [id number]

## 2019-04-30 ENCOUNTER — Ambulatory Visit: Payer: PPO | Attending: Internal Medicine

## 2019-04-30 DIAGNOSIS — Z23 Encounter for immunization: Secondary | ICD-10-CM | POA: Insufficient documentation

## 2019-04-30 NOTE — Progress Notes (Signed)
   Covid-19 Vaccination Clinic  Name:  Lauren Herrera    MRN: [id number] DOB: 1935-06-04  04/30/2019  [name] was observed post Covid-19 immunization for 15 minutes without incident. She was provided with Vaccine Information Sheet and instruction to access the V-Safe system.   [name] was instructed to call 911 with any severe reactions post vaccine: Marland Kitchen Difficulty breathing  . Swelling of face and throat  . A fast heartbeat  . A bad rash all over body  . Dizziness and weakness   Immunizations Administered    Name Date Dose VIS Date Route   Pfizer COVID-19 Vaccine 04/30/2019  2:49 PM 0.3 mL 02/02/2019 Intramuscular   Manufacturer: [place]   Lot: [id number]   [place]: [id number]

## 2019-05-16 DIAGNOSIS — G894 Chronic pain syndrome: Secondary | ICD-10-CM | POA: Diagnosis not present

## 2019-05-16 DIAGNOSIS — Z79891 Long term (current) use of opiate analgesic: Secondary | ICD-10-CM | POA: Diagnosis not present

## 2019-05-16 DIAGNOSIS — M961 Postlaminectomy syndrome, not elsewhere classified: Secondary | ICD-10-CM | POA: Diagnosis not present

## 2019-05-16 DIAGNOSIS — M0580 Other rheumatoid arthritis with rheumatoid factor of unspecified site: Secondary | ICD-10-CM | POA: Diagnosis not present

## 2019-05-23 DIAGNOSIS — M1990 Unspecified osteoarthritis, unspecified site: Secondary | ICD-10-CM | POA: Diagnosis not present

## 2019-05-23 DIAGNOSIS — M069 Rheumatoid arthritis, unspecified: Secondary | ICD-10-CM | POA: Diagnosis not present

## 2019-05-23 DIAGNOSIS — E7800 Pure hypercholesterolemia, unspecified: Secondary | ICD-10-CM | POA: Diagnosis not present

## 2019-05-23 DIAGNOSIS — I10 Essential (primary) hypertension: Secondary | ICD-10-CM | POA: Diagnosis not present

## 2019-05-23 DIAGNOSIS — F330 Major depressive disorder, recurrent, mild: Secondary | ICD-10-CM | POA: Diagnosis not present

## 2019-05-23 DIAGNOSIS — M810 Age-related osteoporosis without current pathological fracture: Secondary | ICD-10-CM | POA: Diagnosis not present

## 2019-05-23 DIAGNOSIS — E039 Hypothyroidism, unspecified: Secondary | ICD-10-CM | POA: Diagnosis not present

## 2019-06-12 DIAGNOSIS — F330 Major depressive disorder, recurrent, mild: Secondary | ICD-10-CM | POA: Diagnosis not present

## 2019-06-12 DIAGNOSIS — I10 Essential (primary) hypertension: Secondary | ICD-10-CM | POA: Diagnosis not present

## 2019-06-12 DIAGNOSIS — M069 Rheumatoid arthritis, unspecified: Secondary | ICD-10-CM | POA: Diagnosis not present

## 2019-06-12 DIAGNOSIS — E039 Hypothyroidism, unspecified: Secondary | ICD-10-CM | POA: Diagnosis not present

## 2019-06-12 DIAGNOSIS — E7800 Pure hypercholesterolemia, unspecified: Secondary | ICD-10-CM | POA: Diagnosis not present

## 2019-06-12 DIAGNOSIS — M810 Age-related osteoporosis without current pathological fracture: Secondary | ICD-10-CM | POA: Diagnosis not present

## 2019-06-12 DIAGNOSIS — M1990 Unspecified osteoarthritis, unspecified site: Secondary | ICD-10-CM | POA: Diagnosis not present

## 2019-07-03 DIAGNOSIS — I10 Essential (primary) hypertension: Secondary | ICD-10-CM | POA: Diagnosis not present

## 2019-07-03 DIAGNOSIS — M069 Rheumatoid arthritis, unspecified: Secondary | ICD-10-CM | POA: Diagnosis not present

## 2019-07-03 DIAGNOSIS — M810 Age-related osteoporosis without current pathological fracture: Secondary | ICD-10-CM | POA: Diagnosis not present

## 2019-07-03 DIAGNOSIS — M1990 Unspecified osteoarthritis, unspecified site: Secondary | ICD-10-CM | POA: Diagnosis not present

## 2019-07-03 DIAGNOSIS — E7800 Pure hypercholesterolemia, unspecified: Secondary | ICD-10-CM | POA: Diagnosis not present

## 2019-07-03 DIAGNOSIS — E039 Hypothyroidism, unspecified: Secondary | ICD-10-CM | POA: Diagnosis not present

## 2019-07-03 DIAGNOSIS — F330 Major depressive disorder, recurrent, mild: Secondary | ICD-10-CM | POA: Diagnosis not present

## 2019-07-18 DIAGNOSIS — M0580 Other rheumatoid arthritis with rheumatoid factor of unspecified site: Secondary | ICD-10-CM | POA: Diagnosis not present

## 2019-07-18 DIAGNOSIS — G894 Chronic pain syndrome: Secondary | ICD-10-CM | POA: Diagnosis not present

## 2019-07-18 DIAGNOSIS — M961 Postlaminectomy syndrome, not elsewhere classified: Secondary | ICD-10-CM | POA: Diagnosis not present

## 2019-07-18 DIAGNOSIS — Z79891 Long term (current) use of opiate analgesic: Secondary | ICD-10-CM | POA: Diagnosis not present

## 2019-08-06 DIAGNOSIS — E039 Hypothyroidism, unspecified: Secondary | ICD-10-CM | POA: Diagnosis not present

## 2019-08-06 DIAGNOSIS — I10 Essential (primary) hypertension: Secondary | ICD-10-CM | POA: Diagnosis not present

## 2019-08-06 DIAGNOSIS — F330 Major depressive disorder, recurrent, mild: Secondary | ICD-10-CM | POA: Diagnosis not present

## 2019-08-06 DIAGNOSIS — M810 Age-related osteoporosis without current pathological fracture: Secondary | ICD-10-CM | POA: Diagnosis not present

## 2019-08-06 DIAGNOSIS — E7800 Pure hypercholesterolemia, unspecified: Secondary | ICD-10-CM | POA: Diagnosis not present

## 2019-08-06 DIAGNOSIS — M069 Rheumatoid arthritis, unspecified: Secondary | ICD-10-CM | POA: Diagnosis not present

## 2019-08-06 DIAGNOSIS — M1990 Unspecified osteoarthritis, unspecified site: Secondary | ICD-10-CM | POA: Diagnosis not present

## 2019-08-07 IMAGING — CR DG WRIST COMPLETE 3+V*R*
4 series · 4 of 4 positions shown · non-contrast
Comparison: None.

CLINICAL DATA: Injury, wrist pain

EXAM:
RIGHT WRIST - COMPLETE 3+ VIEW

[x wrist pa right]
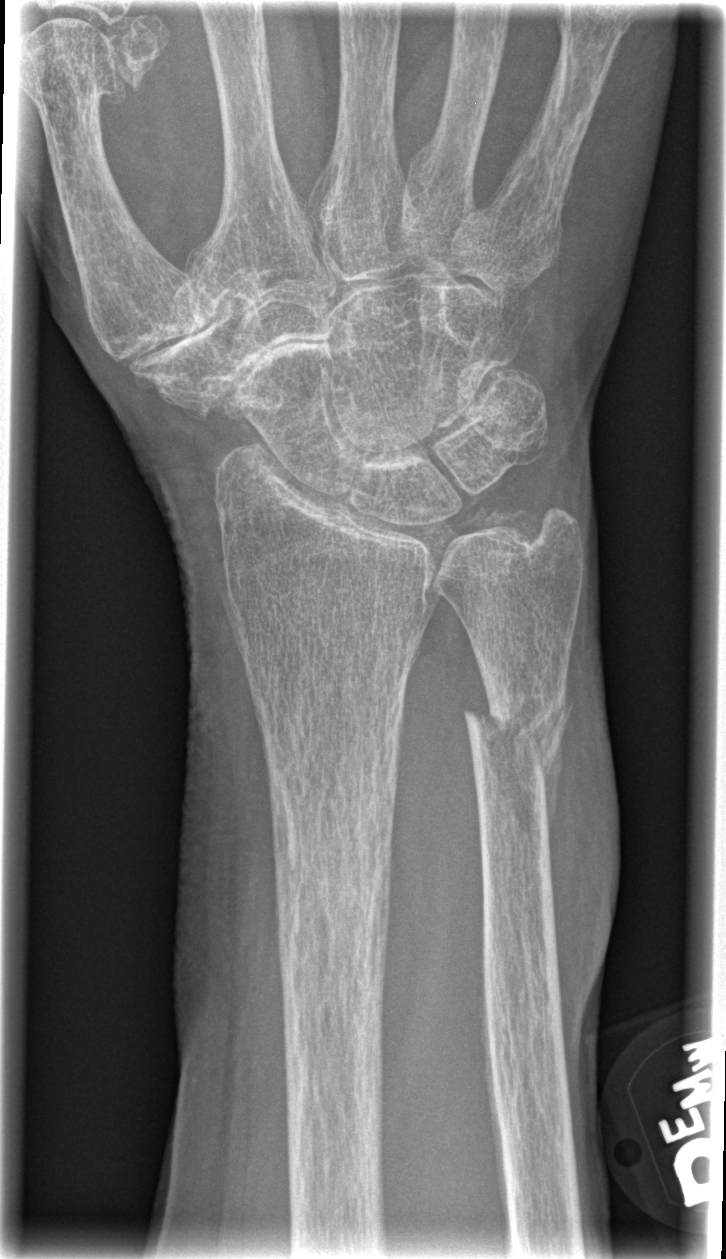

[x wrist obl right]
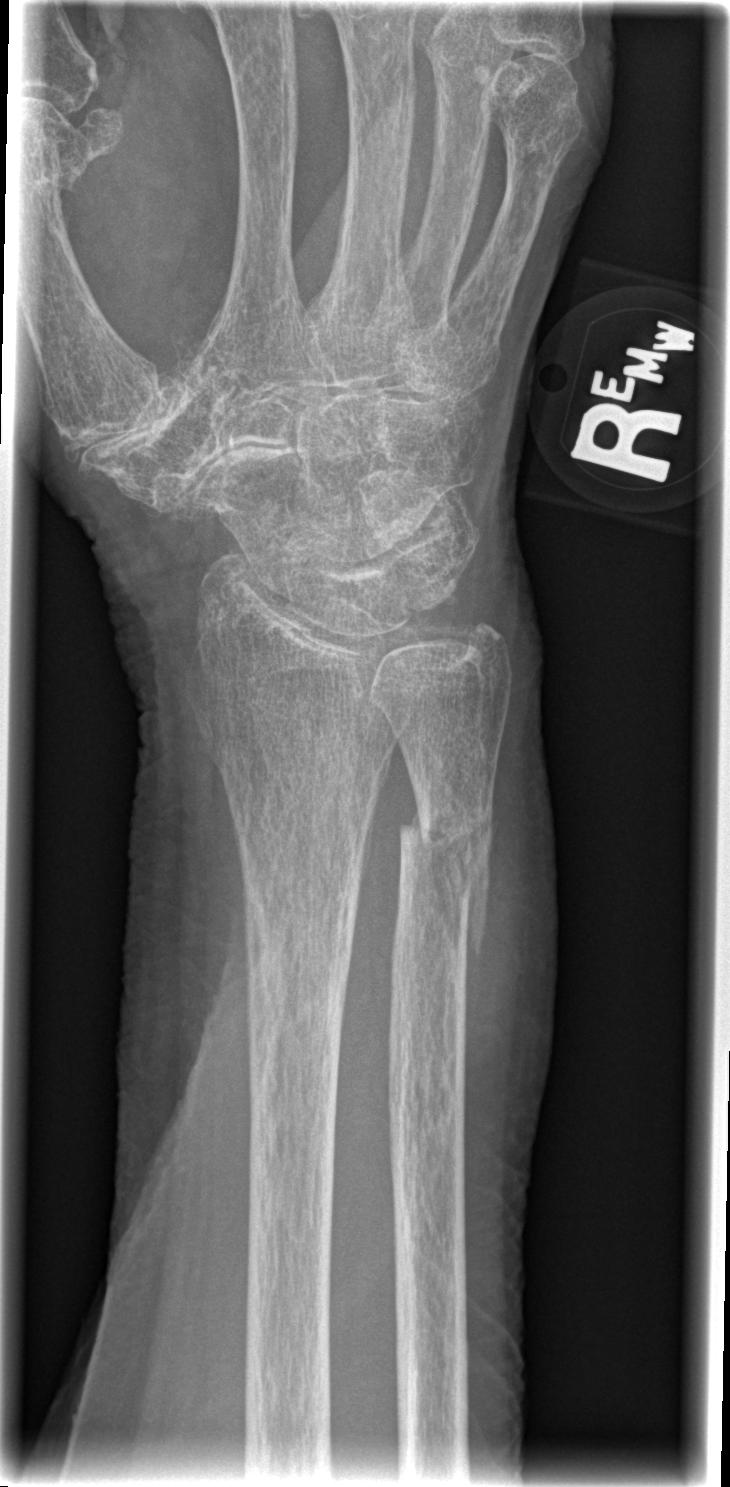

[x wrist lat right]
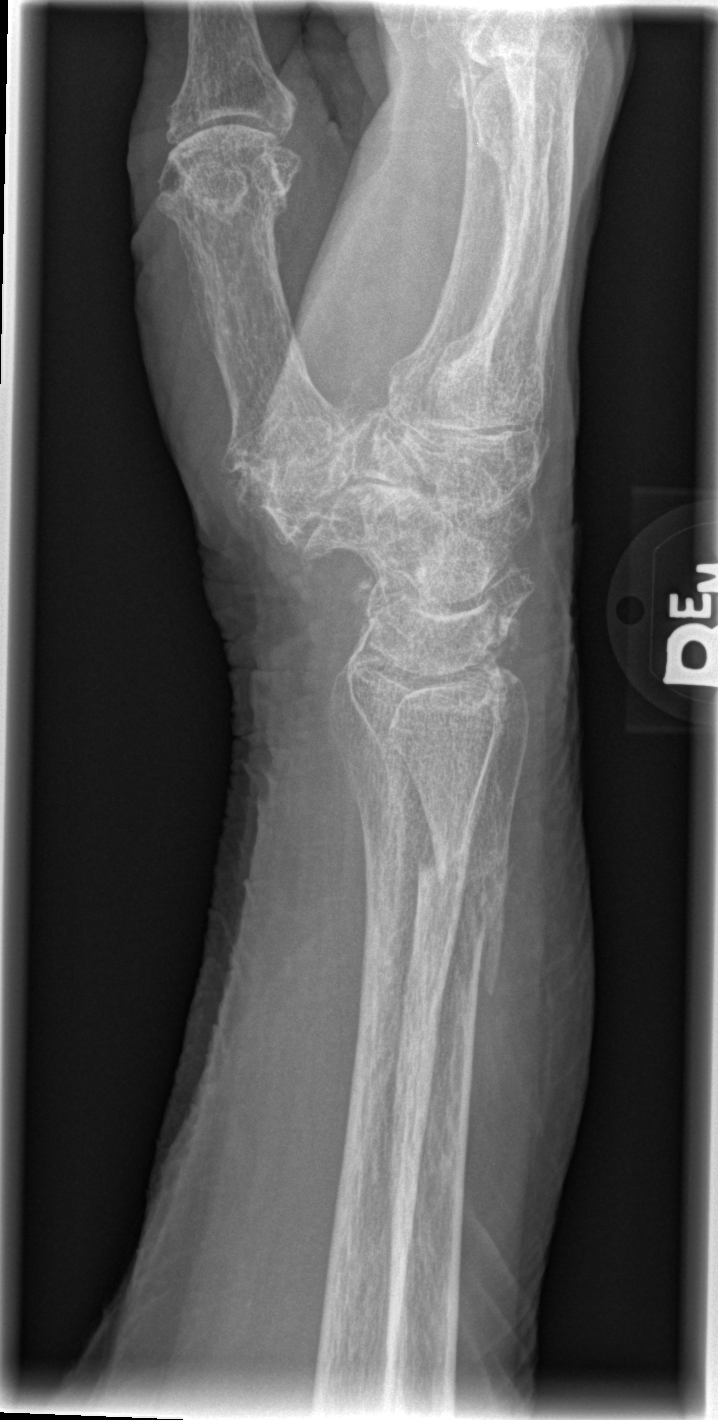

[x navicular]
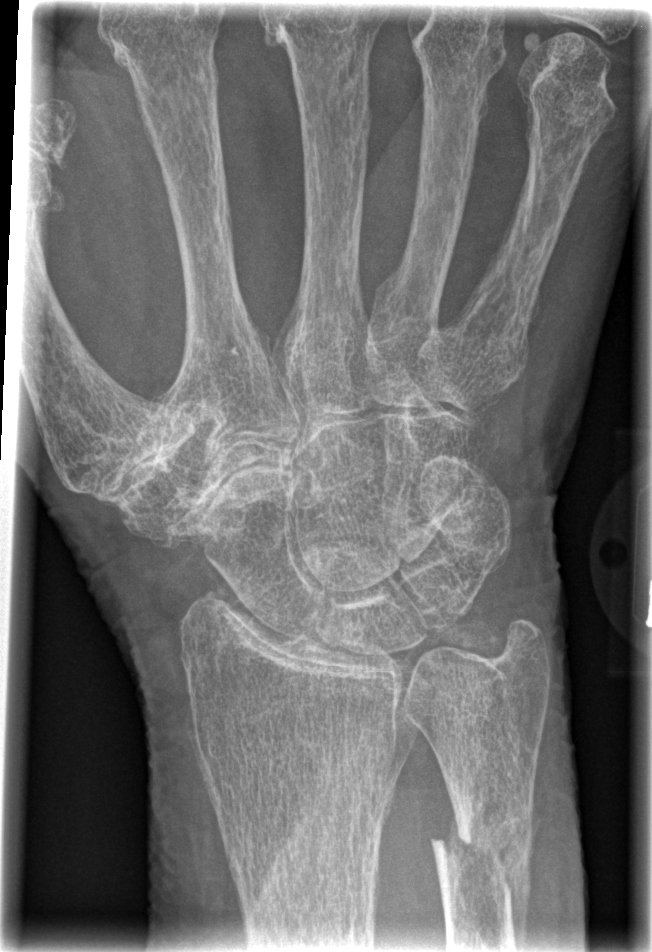

[4 of 4 positions shown; findings below may reference images not displayed]

FINDINGS: Mildly displaced fracture distal ulna approximately 2 cm proximal to
the wrist joint.

Osteopenia. Chondrocalcinosis in the triangular fibrocartilage.
Moderate degenerative change of the base of the thumb. No other
fracture
IMPRESSION: Mildly displaced fracture distal ulna.

## 2019-08-30 DIAGNOSIS — E7800 Pure hypercholesterolemia, unspecified: Secondary | ICD-10-CM | POA: Diagnosis not present

## 2019-08-30 DIAGNOSIS — M069 Rheumatoid arthritis, unspecified: Secondary | ICD-10-CM | POA: Diagnosis not present

## 2019-08-30 DIAGNOSIS — F330 Major depressive disorder, recurrent, mild: Secondary | ICD-10-CM | POA: Diagnosis not present

## 2019-08-30 DIAGNOSIS — I10 Essential (primary) hypertension: Secondary | ICD-10-CM | POA: Diagnosis not present

## 2019-08-30 DIAGNOSIS — M1990 Unspecified osteoarthritis, unspecified site: Secondary | ICD-10-CM | POA: Diagnosis not present

## 2019-08-30 DIAGNOSIS — M810 Age-related osteoporosis without current pathological fracture: Secondary | ICD-10-CM | POA: Diagnosis not present

## 2019-08-30 DIAGNOSIS — E039 Hypothyroidism, unspecified: Secondary | ICD-10-CM | POA: Diagnosis not present

## 2019-09-12 DIAGNOSIS — M0580 Other rheumatoid arthritis with rheumatoid factor of unspecified site: Secondary | ICD-10-CM | POA: Diagnosis not present

## 2019-09-12 DIAGNOSIS — M961 Postlaminectomy syndrome, not elsewhere classified: Secondary | ICD-10-CM | POA: Diagnosis not present

## 2019-09-12 DIAGNOSIS — G894 Chronic pain syndrome: Secondary | ICD-10-CM | POA: Diagnosis not present

## 2019-09-12 DIAGNOSIS — Z79891 Long term (current) use of opiate analgesic: Secondary | ICD-10-CM | POA: Diagnosis not present

## 2019-09-28 DIAGNOSIS — M069 Rheumatoid arthritis, unspecified: Secondary | ICD-10-CM | POA: Diagnosis not present

## 2019-09-28 DIAGNOSIS — M1990 Unspecified osteoarthritis, unspecified site: Secondary | ICD-10-CM | POA: Diagnosis not present

## 2019-09-28 DIAGNOSIS — E039 Hypothyroidism, unspecified: Secondary | ICD-10-CM | POA: Diagnosis not present

## 2019-09-28 DIAGNOSIS — M810 Age-related osteoporosis without current pathological fracture: Secondary | ICD-10-CM | POA: Diagnosis not present

## 2019-09-28 DIAGNOSIS — I10 Essential (primary) hypertension: Secondary | ICD-10-CM | POA: Diagnosis not present

## 2019-09-28 DIAGNOSIS — E7800 Pure hypercholesterolemia, unspecified: Secondary | ICD-10-CM | POA: Diagnosis not present

## 2019-09-28 DIAGNOSIS — F330 Major depressive disorder, recurrent, mild: Secondary | ICD-10-CM | POA: Diagnosis not present

## 2019-11-13 DIAGNOSIS — I10 Essential (primary) hypertension: Secondary | ICD-10-CM | POA: Diagnosis not present

## 2019-11-13 DIAGNOSIS — F330 Major depressive disorder, recurrent, mild: Secondary | ICD-10-CM | POA: Diagnosis not present

## 2019-11-13 DIAGNOSIS — M810 Age-related osteoporosis without current pathological fracture: Secondary | ICD-10-CM | POA: Diagnosis not present

## 2019-11-13 DIAGNOSIS — E7800 Pure hypercholesterolemia, unspecified: Secondary | ICD-10-CM | POA: Diagnosis not present

## 2019-11-13 DIAGNOSIS — E039 Hypothyroidism, unspecified: Secondary | ICD-10-CM | POA: Diagnosis not present

## 2019-11-13 DIAGNOSIS — M1990 Unspecified osteoarthritis, unspecified site: Secondary | ICD-10-CM | POA: Diagnosis not present

## 2019-11-13 DIAGNOSIS — M069 Rheumatoid arthritis, unspecified: Secondary | ICD-10-CM | POA: Diagnosis not present

## 2019-11-19 DIAGNOSIS — M0580 Other rheumatoid arthritis with rheumatoid factor of unspecified site: Secondary | ICD-10-CM | POA: Diagnosis not present

## 2019-11-19 DIAGNOSIS — M961 Postlaminectomy syndrome, not elsewhere classified: Secondary | ICD-10-CM | POA: Diagnosis not present

## 2019-11-19 DIAGNOSIS — G894 Chronic pain syndrome: Secondary | ICD-10-CM | POA: Diagnosis not present

## 2019-11-19 DIAGNOSIS — Z79891 Long term (current) use of opiate analgesic: Secondary | ICD-10-CM | POA: Diagnosis not present

## 2019-12-20 DIAGNOSIS — E7800 Pure hypercholesterolemia, unspecified: Secondary | ICD-10-CM | POA: Diagnosis not present

## 2019-12-20 DIAGNOSIS — M069 Rheumatoid arthritis, unspecified: Secondary | ICD-10-CM | POA: Diagnosis not present

## 2019-12-20 DIAGNOSIS — M1990 Unspecified osteoarthritis, unspecified site: Secondary | ICD-10-CM | POA: Diagnosis not present

## 2019-12-20 DIAGNOSIS — M810 Age-related osteoporosis without current pathological fracture: Secondary | ICD-10-CM | POA: Diagnosis not present

## 2019-12-20 DIAGNOSIS — F330 Major depressive disorder, recurrent, mild: Secondary | ICD-10-CM | POA: Diagnosis not present

## 2019-12-20 DIAGNOSIS — I10 Essential (primary) hypertension: Secondary | ICD-10-CM | POA: Diagnosis not present

## 2019-12-20 DIAGNOSIS — E039 Hypothyroidism, unspecified: Secondary | ICD-10-CM | POA: Diagnosis not present

## 2020-01-14 DIAGNOSIS — M961 Postlaminectomy syndrome, not elsewhere classified: Secondary | ICD-10-CM | POA: Diagnosis not present

## 2020-01-14 DIAGNOSIS — Z79891 Long term (current) use of opiate analgesic: Secondary | ICD-10-CM | POA: Diagnosis not present

## 2020-01-14 DIAGNOSIS — G894 Chronic pain syndrome: Secondary | ICD-10-CM | POA: Diagnosis not present

## 2020-01-14 DIAGNOSIS — M0580 Other rheumatoid arthritis with rheumatoid factor of unspecified site: Secondary | ICD-10-CM | POA: Diagnosis not present

## 2020-01-16 DIAGNOSIS — E039 Hypothyroidism, unspecified: Secondary | ICD-10-CM | POA: Diagnosis not present

## 2020-01-16 DIAGNOSIS — F330 Major depressive disorder, recurrent, mild: Secondary | ICD-10-CM | POA: Diagnosis not present

## 2020-01-16 DIAGNOSIS — M1990 Unspecified osteoarthritis, unspecified site: Secondary | ICD-10-CM | POA: Diagnosis not present

## 2020-01-16 DIAGNOSIS — M810 Age-related osteoporosis without current pathological fracture: Secondary | ICD-10-CM | POA: Diagnosis not present

## 2020-01-16 DIAGNOSIS — I10 Essential (primary) hypertension: Secondary | ICD-10-CM | POA: Diagnosis not present

## 2020-01-16 DIAGNOSIS — K219 Gastro-esophageal reflux disease without esophagitis: Secondary | ICD-10-CM | POA: Diagnosis not present

## 2020-01-16 DIAGNOSIS — M069 Rheumatoid arthritis, unspecified: Secondary | ICD-10-CM | POA: Diagnosis not present

## 2020-01-16 DIAGNOSIS — E7800 Pure hypercholesterolemia, unspecified: Secondary | ICD-10-CM | POA: Diagnosis not present

## 2020-01-31 DIAGNOSIS — Z1231 Encounter for screening mammogram for malignant neoplasm of breast: Secondary | ICD-10-CM | POA: Diagnosis not present

## 2020-02-19 DIAGNOSIS — L570 Actinic keratosis: Secondary | ICD-10-CM | POA: Diagnosis not present

## 2020-02-19 DIAGNOSIS — X32XXXD Exposure to sunlight, subsequent encounter: Secondary | ICD-10-CM | POA: Diagnosis not present

## 2020-02-25 DIAGNOSIS — E7800 Pure hypercholesterolemia, unspecified: Secondary | ICD-10-CM | POA: Diagnosis not present

## 2020-02-25 DIAGNOSIS — E039 Hypothyroidism, unspecified: Secondary | ICD-10-CM | POA: Diagnosis not present

## 2020-02-25 DIAGNOSIS — D72829 Elevated white blood cell count, unspecified: Secondary | ICD-10-CM | POA: Diagnosis not present

## 2020-02-25 DIAGNOSIS — Z0000 Encounter for general adult medical examination without abnormal findings: Secondary | ICD-10-CM | POA: Diagnosis not present

## 2020-02-25 DIAGNOSIS — M069 Rheumatoid arthritis, unspecified: Secondary | ICD-10-CM | POA: Diagnosis not present

## 2020-02-25 DIAGNOSIS — Z1389 Encounter for screening for other disorder: Secondary | ICD-10-CM | POA: Diagnosis not present

## 2020-02-25 DIAGNOSIS — Z23 Encounter for immunization: Secondary | ICD-10-CM | POA: Diagnosis not present

## 2020-02-25 DIAGNOSIS — I10 Essential (primary) hypertension: Secondary | ICD-10-CM | POA: Diagnosis not present

## 2020-03-10 DIAGNOSIS — M810 Age-related osteoporosis without current pathological fracture: Secondary | ICD-10-CM | POA: Diagnosis not present

## 2020-03-10 DIAGNOSIS — M1990 Unspecified osteoarthritis, unspecified site: Secondary | ICD-10-CM | POA: Diagnosis not present

## 2020-03-10 DIAGNOSIS — F330 Major depressive disorder, recurrent, mild: Secondary | ICD-10-CM | POA: Diagnosis not present

## 2020-03-10 DIAGNOSIS — K219 Gastro-esophageal reflux disease without esophagitis: Secondary | ICD-10-CM | POA: Diagnosis not present

## 2020-03-10 DIAGNOSIS — M069 Rheumatoid arthritis, unspecified: Secondary | ICD-10-CM | POA: Diagnosis not present

## 2020-03-10 DIAGNOSIS — E7800 Pure hypercholesterolemia, unspecified: Secondary | ICD-10-CM | POA: Diagnosis not present

## 2020-03-10 DIAGNOSIS — I10 Essential (primary) hypertension: Secondary | ICD-10-CM | POA: Diagnosis not present

## 2020-03-10 DIAGNOSIS — E039 Hypothyroidism, unspecified: Secondary | ICD-10-CM | POA: Diagnosis not present

## 2020-04-11 DIAGNOSIS — M1990 Unspecified osteoarthritis, unspecified site: Secondary | ICD-10-CM | POA: Diagnosis not present

## 2020-04-11 DIAGNOSIS — E7800 Pure hypercholesterolemia, unspecified: Secondary | ICD-10-CM | POA: Diagnosis not present

## 2020-04-11 DIAGNOSIS — M810 Age-related osteoporosis without current pathological fracture: Secondary | ICD-10-CM | POA: Diagnosis not present

## 2020-04-11 DIAGNOSIS — E039 Hypothyroidism, unspecified: Secondary | ICD-10-CM | POA: Diagnosis not present

## 2020-04-11 DIAGNOSIS — M069 Rheumatoid arthritis, unspecified: Secondary | ICD-10-CM | POA: Diagnosis not present

## 2020-04-11 DIAGNOSIS — K219 Gastro-esophageal reflux disease without esophagitis: Secondary | ICD-10-CM | POA: Diagnosis not present

## 2020-04-11 DIAGNOSIS — F330 Major depressive disorder, recurrent, mild: Secondary | ICD-10-CM | POA: Diagnosis not present

## 2020-04-11 DIAGNOSIS — I10 Essential (primary) hypertension: Secondary | ICD-10-CM | POA: Diagnosis not present

## 2020-04-21 DIAGNOSIS — Z79891 Long term (current) use of opiate analgesic: Secondary | ICD-10-CM | POA: Diagnosis not present

## 2020-04-21 DIAGNOSIS — M0580 Other rheumatoid arthritis with rheumatoid factor of unspecified site: Secondary | ICD-10-CM | POA: Diagnosis not present

## 2020-04-21 DIAGNOSIS — G894 Chronic pain syndrome: Secondary | ICD-10-CM | POA: Diagnosis not present

## 2020-04-21 DIAGNOSIS — M961 Postlaminectomy syndrome, not elsewhere classified: Secondary | ICD-10-CM | POA: Diagnosis not present

## 2020-05-20 DIAGNOSIS — M069 Rheumatoid arthritis, unspecified: Secondary | ICD-10-CM | POA: Diagnosis not present

## 2020-05-20 DIAGNOSIS — I10 Essential (primary) hypertension: Secondary | ICD-10-CM | POA: Diagnosis not present

## 2020-05-20 DIAGNOSIS — M1990 Unspecified osteoarthritis, unspecified site: Secondary | ICD-10-CM | POA: Diagnosis not present

## 2020-05-20 DIAGNOSIS — E039 Hypothyroidism, unspecified: Secondary | ICD-10-CM | POA: Diagnosis not present

## 2020-05-20 DIAGNOSIS — K219 Gastro-esophageal reflux disease without esophagitis: Secondary | ICD-10-CM | POA: Diagnosis not present

## 2020-05-20 DIAGNOSIS — M810 Age-related osteoporosis without current pathological fracture: Secondary | ICD-10-CM | POA: Diagnosis not present

## 2020-05-20 DIAGNOSIS — F330 Major depressive disorder, recurrent, mild: Secondary | ICD-10-CM | POA: Diagnosis not present

## 2020-05-20 DIAGNOSIS — E7800 Pure hypercholesterolemia, unspecified: Secondary | ICD-10-CM | POA: Diagnosis not present

## 2020-05-26 DIAGNOSIS — I10 Essential (primary) hypertension: Secondary | ICD-10-CM | POA: Diagnosis not present

## 2020-05-26 DIAGNOSIS — M810 Age-related osteoporosis without current pathological fracture: Secondary | ICD-10-CM | POA: Diagnosis not present

## 2020-05-26 DIAGNOSIS — F330 Major depressive disorder, recurrent, mild: Secondary | ICD-10-CM | POA: Diagnosis not present

## 2020-05-26 DIAGNOSIS — E7800 Pure hypercholesterolemia, unspecified: Secondary | ICD-10-CM | POA: Diagnosis not present

## 2020-05-26 DIAGNOSIS — M1990 Unspecified osteoarthritis, unspecified site: Secondary | ICD-10-CM | POA: Diagnosis not present

## 2020-05-26 DIAGNOSIS — E039 Hypothyroidism, unspecified: Secondary | ICD-10-CM | POA: Diagnosis not present

## 2020-05-26 DIAGNOSIS — M069 Rheumatoid arthritis, unspecified: Secondary | ICD-10-CM | POA: Diagnosis not present

## 2020-05-26 DIAGNOSIS — K219 Gastro-esophageal reflux disease without esophagitis: Secondary | ICD-10-CM | POA: Diagnosis not present

## 2020-06-16 DIAGNOSIS — M47812 Spondylosis without myelopathy or radiculopathy, cervical region: Secondary | ICD-10-CM | POA: Diagnosis not present

## 2020-06-16 DIAGNOSIS — M961 Postlaminectomy syndrome, not elsewhere classified: Secondary | ICD-10-CM | POA: Diagnosis not present

## 2020-06-16 DIAGNOSIS — G894 Chronic pain syndrome: Secondary | ICD-10-CM | POA: Diagnosis not present

## 2020-06-16 DIAGNOSIS — Z79891 Long term (current) use of opiate analgesic: Secondary | ICD-10-CM | POA: Diagnosis not present

## 2020-07-22 DIAGNOSIS — I10 Essential (primary) hypertension: Secondary | ICD-10-CM | POA: Diagnosis not present

## 2020-07-22 DIAGNOSIS — F330 Major depressive disorder, recurrent, mild: Secondary | ICD-10-CM | POA: Diagnosis not present

## 2020-07-22 DIAGNOSIS — E039 Hypothyroidism, unspecified: Secondary | ICD-10-CM | POA: Diagnosis not present

## 2020-07-22 DIAGNOSIS — M1990 Unspecified osteoarthritis, unspecified site: Secondary | ICD-10-CM | POA: Diagnosis not present

## 2020-07-22 DIAGNOSIS — E7800 Pure hypercholesterolemia, unspecified: Secondary | ICD-10-CM | POA: Diagnosis not present

## 2020-07-22 DIAGNOSIS — K219 Gastro-esophageal reflux disease without esophagitis: Secondary | ICD-10-CM | POA: Diagnosis not present

## 2020-07-22 DIAGNOSIS — M069 Rheumatoid arthritis, unspecified: Secondary | ICD-10-CM | POA: Diagnosis not present

## 2020-07-22 DIAGNOSIS — M810 Age-related osteoporosis without current pathological fracture: Secondary | ICD-10-CM | POA: Diagnosis not present

## 2020-08-11 DIAGNOSIS — Z79891 Long term (current) use of opiate analgesic: Secondary | ICD-10-CM | POA: Diagnosis not present

## 2020-08-11 DIAGNOSIS — M961 Postlaminectomy syndrome, not elsewhere classified: Secondary | ICD-10-CM | POA: Diagnosis not present

## 2020-08-11 DIAGNOSIS — G894 Chronic pain syndrome: Secondary | ICD-10-CM | POA: Diagnosis not present

## 2020-08-11 DIAGNOSIS — M47812 Spondylosis without myelopathy or radiculopathy, cervical region: Secondary | ICD-10-CM | POA: Diagnosis not present

## 2020-08-19 DIAGNOSIS — M069 Rheumatoid arthritis, unspecified: Secondary | ICD-10-CM | POA: Diagnosis not present

## 2020-08-19 DIAGNOSIS — M810 Age-related osteoporosis without current pathological fracture: Secondary | ICD-10-CM | POA: Diagnosis not present

## 2020-08-19 DIAGNOSIS — M1990 Unspecified osteoarthritis, unspecified site: Secondary | ICD-10-CM | POA: Diagnosis not present

## 2020-08-19 DIAGNOSIS — E039 Hypothyroidism, unspecified: Secondary | ICD-10-CM | POA: Diagnosis not present

## 2020-08-19 DIAGNOSIS — K219 Gastro-esophageal reflux disease without esophagitis: Secondary | ICD-10-CM | POA: Diagnosis not present

## 2020-08-19 DIAGNOSIS — E7800 Pure hypercholesterolemia, unspecified: Secondary | ICD-10-CM | POA: Diagnosis not present

## 2020-08-19 DIAGNOSIS — I10 Essential (primary) hypertension: Secondary | ICD-10-CM | POA: Diagnosis not present

## 2020-08-19 DIAGNOSIS — F330 Major depressive disorder, recurrent, mild: Secondary | ICD-10-CM | POA: Diagnosis not present

## 2020-08-22 DIAGNOSIS — E7800 Pure hypercholesterolemia, unspecified: Secondary | ICD-10-CM | POA: Diagnosis not present

## 2020-08-22 DIAGNOSIS — F330 Major depressive disorder, recurrent, mild: Secondary | ICD-10-CM | POA: Diagnosis not present

## 2020-08-22 DIAGNOSIS — M1990 Unspecified osteoarthritis, unspecified site: Secondary | ICD-10-CM | POA: Diagnosis not present

## 2020-08-22 DIAGNOSIS — K219 Gastro-esophageal reflux disease without esophagitis: Secondary | ICD-10-CM | POA: Diagnosis not present

## 2020-08-22 DIAGNOSIS — E039 Hypothyroidism, unspecified: Secondary | ICD-10-CM | POA: Diagnosis not present

## 2020-08-22 DIAGNOSIS — I10 Essential (primary) hypertension: Secondary | ICD-10-CM | POA: Diagnosis not present

## 2020-08-22 DIAGNOSIS — M069 Rheumatoid arthritis, unspecified: Secondary | ICD-10-CM | POA: Diagnosis not present

## 2020-08-22 DIAGNOSIS — M810 Age-related osteoporosis without current pathological fracture: Secondary | ICD-10-CM | POA: Diagnosis not present

## 2020-10-17 DIAGNOSIS — M810 Age-related osteoporosis without current pathological fracture: Secondary | ICD-10-CM | POA: Diagnosis not present

## 2020-10-17 DIAGNOSIS — M1990 Unspecified osteoarthritis, unspecified site: Secondary | ICD-10-CM | POA: Diagnosis not present

## 2020-10-17 DIAGNOSIS — K219 Gastro-esophageal reflux disease without esophagitis: Secondary | ICD-10-CM | POA: Diagnosis not present

## 2020-10-17 DIAGNOSIS — F330 Major depressive disorder, recurrent, mild: Secondary | ICD-10-CM | POA: Diagnosis not present

## 2020-10-17 DIAGNOSIS — M069 Rheumatoid arthritis, unspecified: Secondary | ICD-10-CM | POA: Diagnosis not present

## 2020-10-17 DIAGNOSIS — I10 Essential (primary) hypertension: Secondary | ICD-10-CM | POA: Diagnosis not present

## 2020-10-17 DIAGNOSIS — E7800 Pure hypercholesterolemia, unspecified: Secondary | ICD-10-CM | POA: Diagnosis not present

## 2020-10-17 DIAGNOSIS — E039 Hypothyroidism, unspecified: Secondary | ICD-10-CM | POA: Diagnosis not present

## 2020-12-15 DIAGNOSIS — F330 Major depressive disorder, recurrent, mild: Secondary | ICD-10-CM | POA: Diagnosis not present

## 2020-12-15 DIAGNOSIS — M810 Age-related osteoporosis without current pathological fracture: Secondary | ICD-10-CM | POA: Diagnosis not present

## 2020-12-15 DIAGNOSIS — I10 Essential (primary) hypertension: Secondary | ICD-10-CM | POA: Diagnosis not present

## 2020-12-15 DIAGNOSIS — E7800 Pure hypercholesterolemia, unspecified: Secondary | ICD-10-CM | POA: Diagnosis not present

## 2020-12-15 DIAGNOSIS — M1990 Unspecified osteoarthritis, unspecified site: Secondary | ICD-10-CM | POA: Diagnosis not present

## 2020-12-15 DIAGNOSIS — M069 Rheumatoid arthritis, unspecified: Secondary | ICD-10-CM | POA: Diagnosis not present

## 2020-12-15 DIAGNOSIS — E039 Hypothyroidism, unspecified: Secondary | ICD-10-CM | POA: Diagnosis not present

## 2020-12-15 DIAGNOSIS — K219 Gastro-esophageal reflux disease without esophagitis: Secondary | ICD-10-CM | POA: Diagnosis not present

## 2021-01-01 DIAGNOSIS — G894 Chronic pain syndrome: Secondary | ICD-10-CM | POA: Diagnosis not present

## 2021-01-01 DIAGNOSIS — M47812 Spondylosis without myelopathy or radiculopathy, cervical region: Secondary | ICD-10-CM | POA: Diagnosis not present

## 2021-01-01 DIAGNOSIS — Z79891 Long term (current) use of opiate analgesic: Secondary | ICD-10-CM | POA: Diagnosis not present

## 2021-01-01 DIAGNOSIS — M961 Postlaminectomy syndrome, not elsewhere classified: Secondary | ICD-10-CM | POA: Diagnosis not present

## 2021-01-20 DIAGNOSIS — M069 Rheumatoid arthritis, unspecified: Secondary | ICD-10-CM | POA: Diagnosis not present

## 2021-01-20 DIAGNOSIS — E039 Hypothyroidism, unspecified: Secondary | ICD-10-CM | POA: Diagnosis not present

## 2021-01-20 DIAGNOSIS — F330 Major depressive disorder, recurrent, mild: Secondary | ICD-10-CM | POA: Diagnosis not present

## 2021-01-20 DIAGNOSIS — I10 Essential (primary) hypertension: Secondary | ICD-10-CM | POA: Diagnosis not present

## 2021-01-20 DIAGNOSIS — M1990 Unspecified osteoarthritis, unspecified site: Secondary | ICD-10-CM | POA: Diagnosis not present

## 2021-01-20 DIAGNOSIS — M810 Age-related osteoporosis without current pathological fracture: Secondary | ICD-10-CM | POA: Diagnosis not present

## 2021-01-20 DIAGNOSIS — K219 Gastro-esophageal reflux disease without esophagitis: Secondary | ICD-10-CM | POA: Diagnosis not present

## 2021-01-20 DIAGNOSIS — E7800 Pure hypercholesterolemia, unspecified: Secondary | ICD-10-CM | POA: Diagnosis not present

## 2021-02-18 DIAGNOSIS — E039 Hypothyroidism, unspecified: Secondary | ICD-10-CM | POA: Diagnosis not present

## 2021-02-18 DIAGNOSIS — M810 Age-related osteoporosis without current pathological fracture: Secondary | ICD-10-CM | POA: Diagnosis not present

## 2021-02-18 DIAGNOSIS — K219 Gastro-esophageal reflux disease without esophagitis: Secondary | ICD-10-CM | POA: Diagnosis not present

## 2021-02-18 DIAGNOSIS — M069 Rheumatoid arthritis, unspecified: Secondary | ICD-10-CM | POA: Diagnosis not present

## 2021-02-18 DIAGNOSIS — I10 Essential (primary) hypertension: Secondary | ICD-10-CM | POA: Diagnosis not present

## 2021-02-18 DIAGNOSIS — E7800 Pure hypercholesterolemia, unspecified: Secondary | ICD-10-CM | POA: Diagnosis not present

## 2021-02-18 DIAGNOSIS — F330 Major depressive disorder, recurrent, mild: Secondary | ICD-10-CM | POA: Diagnosis not present

## 2021-02-18 DIAGNOSIS — M1990 Unspecified osteoarthritis, unspecified site: Secondary | ICD-10-CM | POA: Diagnosis not present

## 2021-02-26 DIAGNOSIS — R29898 Other symptoms and signs involving the musculoskeletal system: Secondary | ICD-10-CM | POA: Diagnosis not present

## 2021-02-26 DIAGNOSIS — E7800 Pure hypercholesterolemia, unspecified: Secondary | ICD-10-CM | POA: Diagnosis not present

## 2021-02-26 DIAGNOSIS — G039 Meningitis, unspecified: Secondary | ICD-10-CM | POA: Diagnosis not present

## 2021-02-26 DIAGNOSIS — E039 Hypothyroidism, unspecified: Secondary | ICD-10-CM | POA: Diagnosis not present

## 2021-02-26 DIAGNOSIS — M069 Rheumatoid arthritis, unspecified: Secondary | ICD-10-CM | POA: Diagnosis not present

## 2021-02-26 DIAGNOSIS — I10 Essential (primary) hypertension: Secondary | ICD-10-CM | POA: Diagnosis not present

## 2021-02-26 DIAGNOSIS — Z1389 Encounter for screening for other disorder: Secondary | ICD-10-CM | POA: Diagnosis not present

## 2021-02-26 DIAGNOSIS — Z0000 Encounter for general adult medical examination without abnormal findings: Secondary | ICD-10-CM | POA: Diagnosis not present

## 2021-03-20 DIAGNOSIS — M961 Postlaminectomy syndrome, not elsewhere classified: Secondary | ICD-10-CM | POA: Diagnosis not present

## 2021-03-20 DIAGNOSIS — M47812 Spondylosis without myelopathy or radiculopathy, cervical region: Secondary | ICD-10-CM | POA: Diagnosis not present

## 2021-03-20 DIAGNOSIS — Z79891 Long term (current) use of opiate analgesic: Secondary | ICD-10-CM | POA: Diagnosis not present

## 2021-03-20 DIAGNOSIS — G894 Chronic pain syndrome: Secondary | ICD-10-CM | POA: Diagnosis not present

## 2021-03-23 DIAGNOSIS — E039 Hypothyroidism, unspecified: Secondary | ICD-10-CM | POA: Diagnosis not present

## 2021-03-23 DIAGNOSIS — M810 Age-related osteoporosis without current pathological fracture: Secondary | ICD-10-CM | POA: Diagnosis not present

## 2021-03-23 DIAGNOSIS — F330 Major depressive disorder, recurrent, mild: Secondary | ICD-10-CM | POA: Diagnosis not present

## 2021-03-23 DIAGNOSIS — M1990 Unspecified osteoarthritis, unspecified site: Secondary | ICD-10-CM | POA: Diagnosis not present

## 2021-03-23 DIAGNOSIS — I10 Essential (primary) hypertension: Secondary | ICD-10-CM | POA: Diagnosis not present

## 2021-03-23 DIAGNOSIS — K219 Gastro-esophageal reflux disease without esophagitis: Secondary | ICD-10-CM | POA: Diagnosis not present

## 2021-03-23 DIAGNOSIS — E7800 Pure hypercholesterolemia, unspecified: Secondary | ICD-10-CM | POA: Diagnosis not present

## 2021-03-23 DIAGNOSIS — M069 Rheumatoid arthritis, unspecified: Secondary | ICD-10-CM | POA: Diagnosis not present

## 2021-04-20 DIAGNOSIS — E7800 Pure hypercholesterolemia, unspecified: Secondary | ICD-10-CM | POA: Diagnosis not present

## 2021-04-20 DIAGNOSIS — G894 Chronic pain syndrome: Secondary | ICD-10-CM | POA: Diagnosis not present

## 2021-04-20 DIAGNOSIS — E039 Hypothyroidism, unspecified: Secondary | ICD-10-CM | POA: Diagnosis not present

## 2021-04-20 DIAGNOSIS — M47812 Spondylosis without myelopathy or radiculopathy, cervical region: Secondary | ICD-10-CM | POA: Diagnosis not present

## 2021-04-20 DIAGNOSIS — M961 Postlaminectomy syndrome, not elsewhere classified: Secondary | ICD-10-CM | POA: Diagnosis not present

## 2021-04-20 DIAGNOSIS — I10 Essential (primary) hypertension: Secondary | ICD-10-CM | POA: Diagnosis not present

## 2021-04-20 DIAGNOSIS — Z79891 Long term (current) use of opiate analgesic: Secondary | ICD-10-CM | POA: Diagnosis not present

## 2021-04-23 ENCOUNTER — Other Ambulatory Visit: Payer: Self-pay | Admitting: Family Medicine

## 2021-04-23 ENCOUNTER — Ambulatory Visit
Admission: RE | Admit: 2021-04-23 | Discharge: 2021-04-23 | Disposition: A | Discharge status: Home / Self Care | Payer: PPO | Source: Ambulatory Visit | Attending: Family Medicine | Admitting: Family Medicine

## 2021-04-23 ENCOUNTER — Other Ambulatory Visit: Payer: Self-pay

## 2021-04-23 DIAGNOSIS — S5001XA Contusion of right elbow, initial encounter: Secondary | ICD-10-CM

## 2021-07-07 DIAGNOSIS — M47812 Spondylosis without myelopathy or radiculopathy, cervical region: Secondary | ICD-10-CM | POA: Diagnosis not present

## 2021-07-07 DIAGNOSIS — G894 Chronic pain syndrome: Secondary | ICD-10-CM | POA: Diagnosis not present

## 2021-07-07 DIAGNOSIS — Z79891 Long term (current) use of opiate analgesic: Secondary | ICD-10-CM | POA: Diagnosis not present

## 2021-07-07 DIAGNOSIS — M961 Postlaminectomy syndrome, not elsewhere classified: Secondary | ICD-10-CM | POA: Diagnosis not present

## 2021-09-03 DIAGNOSIS — Z79891 Long term (current) use of opiate analgesic: Secondary | ICD-10-CM | POA: Diagnosis not present

## 2021-09-03 DIAGNOSIS — M47812 Spondylosis without myelopathy or radiculopathy, cervical region: Secondary | ICD-10-CM | POA: Diagnosis not present

## 2021-09-03 DIAGNOSIS — G894 Chronic pain syndrome: Secondary | ICD-10-CM | POA: Diagnosis not present

## 2021-09-03 DIAGNOSIS — M961 Postlaminectomy syndrome, not elsewhere classified: Secondary | ICD-10-CM | POA: Diagnosis not present

## 2021-09-16 DIAGNOSIS — C44529 Squamous cell carcinoma of skin of other part of trunk: Secondary | ICD-10-CM | POA: Diagnosis not present

## 2021-09-16 DIAGNOSIS — L821 Other seborrheic keratosis: Secondary | ICD-10-CM | POA: Diagnosis not present

## 2021-10-06 DIAGNOSIS — Z79891 Long term (current) use of opiate analgesic: Secondary | ICD-10-CM | POA: Diagnosis not present

## 2021-10-06 DIAGNOSIS — M47812 Spondylosis without myelopathy or radiculopathy, cervical region: Secondary | ICD-10-CM | POA: Diagnosis not present

## 2021-10-06 DIAGNOSIS — M961 Postlaminectomy syndrome, not elsewhere classified: Secondary | ICD-10-CM | POA: Diagnosis not present

## 2021-10-06 DIAGNOSIS — G894 Chronic pain syndrome: Secondary | ICD-10-CM | POA: Diagnosis not present

## 2021-10-14 DIAGNOSIS — G6289 Other specified polyneuropathies: Secondary | ICD-10-CM | POA: Diagnosis not present

## 2021-11-05 DIAGNOSIS — G6289 Other specified polyneuropathies: Secondary | ICD-10-CM | POA: Diagnosis not present

## 2021-11-05 DIAGNOSIS — G4700 Insomnia, unspecified: Secondary | ICD-10-CM | POA: Diagnosis not present

## 2022-04-08 DIAGNOSIS — E7800 Pure hypercholesterolemia, unspecified: Secondary | ICD-10-CM | POA: Diagnosis not present

## 2022-04-08 DIAGNOSIS — Z9989 Dependence on other enabling machines and devices: Secondary | ICD-10-CM | POA: Diagnosis not present

## 2022-04-08 DIAGNOSIS — G4700 Insomnia, unspecified: Secondary | ICD-10-CM | POA: Diagnosis not present

## 2022-04-08 DIAGNOSIS — E039 Hypothyroidism, unspecified: Secondary | ICD-10-CM | POA: Diagnosis not present

## 2022-04-08 DIAGNOSIS — I10 Essential (primary) hypertension: Secondary | ICD-10-CM | POA: Diagnosis not present

## 2022-04-08 DIAGNOSIS — M069 Rheumatoid arthritis, unspecified: Secondary | ICD-10-CM | POA: Diagnosis not present

## 2022-04-08 DIAGNOSIS — Z0000 Encounter for general adult medical examination without abnormal findings: Secondary | ICD-10-CM | POA: Diagnosis not present

## 2022-04-08 DIAGNOSIS — Z1331 Encounter for screening for depression: Secondary | ICD-10-CM | POA: Diagnosis not present

## 2022-06-17 DIAGNOSIS — E039 Hypothyroidism, unspecified: Secondary | ICD-10-CM | POA: Diagnosis not present

## 2022-10-18 ENCOUNTER — Other Ambulatory Visit (HOSPITAL_COMMUNITY): Payer: Self-pay

## 2022-10-18 MED ORDER — LISINOPRIL 20 MG PO TABS
20.0000 mg | ORAL_TABLET | Freq: Every day | ORAL | 0 refills | Status: DC
  Filled 2022-10-18 – 2022-10-19 (×2): qty 90, 90d supply, fill #0

## 2022-10-18 MED ORDER — LEVOTHYROXINE SODIUM 75 MCG PO TABS: 75.0000 ug | ORAL_TABLET | Freq: Every morning | ORAL | 2 refills | Status: DC

## 2022-10-18 MED ORDER — ATORVASTATIN CALCIUM 10 MG PO TABS
10.0000 mg | ORAL_TABLET | Freq: Every day | ORAL | 0 refills | Status: DC
  Filled 2022-10-18 – 2022-10-19 (×2): qty 90, 90d supply, fill #0

## 2022-10-18 MED ORDER — AMLODIPINE BESYLATE 2.5 MG PO TABS
2.5000 mg | ORAL_TABLET | Freq: Every day | ORAL | 1 refills | Status: DC
  Filled 2022-10-18 – 2022-10-19 (×2): qty 30, 30d supply, fill #0

## 2022-10-19 ENCOUNTER — Other Ambulatory Visit (HOSPITAL_COMMUNITY): Payer: Self-pay

## 2022-10-19 ENCOUNTER — Other Ambulatory Visit: Payer: Self-pay

## 2022-11-26 ENCOUNTER — Other Ambulatory Visit: Payer: Self-pay

## 2022-11-26 ENCOUNTER — Other Ambulatory Visit (HOSPITAL_COMMUNITY): Payer: Self-pay

## 2022-11-26 MED ORDER — LEVOTHYROXINE SODIUM 75 MCG PO TABS
75.0000 ug | ORAL_TABLET | Freq: Every morning | ORAL | 3 refills | Status: AC
  Filled 2022-11-26: qty 30, 30d supply, fill #0

## 2022-12-10 ENCOUNTER — Other Ambulatory Visit (HOSPITAL_COMMUNITY): Payer: Self-pay

## 2022-12-16 ENCOUNTER — Other Ambulatory Visit (HOSPITAL_COMMUNITY): Payer: Self-pay

## 2022-12-17 ENCOUNTER — Other Ambulatory Visit (HOSPITAL_COMMUNITY): Payer: Self-pay

## 2022-12-17 ENCOUNTER — Other Ambulatory Visit: Payer: Self-pay

## 2022-12-17 MED ORDER — AMLODIPINE BESYLATE 2.5 MG PO TABS
2.5000 mg | ORAL_TABLET | Freq: Every day | ORAL | 1 refills | Status: AC
  Filled 2022-12-17: qty 90, 90d supply, fill #0
  Filled 2023-03-15: qty 90, 90d supply, fill #1

## 2022-12-18 ENCOUNTER — Other Ambulatory Visit (HOSPITAL_COMMUNITY): Payer: Self-pay

## 2022-12-30 ENCOUNTER — Other Ambulatory Visit (HOSPITAL_COMMUNITY): Payer: Self-pay

## 2022-12-30 ENCOUNTER — Other Ambulatory Visit: Payer: Self-pay

## 2022-12-30 DIAGNOSIS — E039 Hypothyroidism, unspecified: Secondary | ICD-10-CM | POA: Diagnosis not present

## 2022-12-30 DIAGNOSIS — Z23 Encounter for immunization: Secondary | ICD-10-CM | POA: Diagnosis not present

## 2022-12-30 DIAGNOSIS — I10 Essential (primary) hypertension: Secondary | ICD-10-CM | POA: Diagnosis not present

## 2022-12-30 DIAGNOSIS — M069 Rheumatoid arthritis, unspecified: Secondary | ICD-10-CM | POA: Diagnosis not present

## 2022-12-30 DIAGNOSIS — K219 Gastro-esophageal reflux disease without esophagitis: Secondary | ICD-10-CM | POA: Diagnosis not present

## 2022-12-30 DIAGNOSIS — E7800 Pure hypercholesterolemia, unspecified: Secondary | ICD-10-CM | POA: Diagnosis not present

## 2022-12-30 MED ORDER — LISINOPRIL 20 MG PO TABS
20.0000 mg | ORAL_TABLET | Freq: Every day | ORAL | 1 refills | Status: DC
  Filled 2022-12-30: qty 90, 90d supply, fill #0
  Filled 2023-03-15: qty 90, 90d supply, fill #1

## 2022-12-30 MED ORDER — AMLODIPINE BESYLATE 2.5 MG PO TABS
2.5000 mg | ORAL_TABLET | Freq: Every day | ORAL | 1 refills | Status: AC
  Filled 2023-06-21: qty 90, 90d supply, fill #0
  Filled 2023-07-19: qty 90, 90d supply, fill #1

## 2022-12-30 MED ORDER — ATORVASTATIN CALCIUM 10 MG PO TABS
10.0000 mg | ORAL_TABLET | Freq: Every evening | ORAL | 1 refills | Status: DC
  Filled 2022-12-30: qty 90, 90d supply, fill #0
  Filled 2023-03-15: qty 90, 90d supply, fill #1

## 2022-12-30 MED ORDER — LEVOTHYROXINE SODIUM 75 MCG PO TABS
75.0000 ug | ORAL_TABLET | Freq: Every day | ORAL | 3 refills | Status: AC
  Filled 2022-12-30: qty 90, 90d supply, fill #0

## 2023-01-10 ENCOUNTER — Other Ambulatory Visit (HOSPITAL_COMMUNITY): Payer: Self-pay

## 2023-03-15 ENCOUNTER — Other Ambulatory Visit: Payer: Self-pay

## 2023-03-15 ENCOUNTER — Other Ambulatory Visit (HOSPITAL_COMMUNITY): Payer: Self-pay

## 2023-04-18 ENCOUNTER — Other Ambulatory Visit: Payer: Self-pay

## 2023-04-18 ENCOUNTER — Other Ambulatory Visit (HOSPITAL_COMMUNITY): Payer: Self-pay

## 2023-04-18 MED ORDER — LEVOTHYROXINE SODIUM 75 MCG PO TABS
75.0000 ug | ORAL_TABLET | Freq: Every morning | ORAL | 0 refills | Status: AC
  Filled 2023-04-18: qty 90, 90d supply, fill #0

## 2023-05-03 ENCOUNTER — Other Ambulatory Visit (HOSPITAL_COMMUNITY): Payer: Self-pay

## 2023-05-03 DIAGNOSIS — E7800 Pure hypercholesterolemia, unspecified: Secondary | ICD-10-CM | POA: Diagnosis not present

## 2023-05-03 DIAGNOSIS — G4700 Insomnia, unspecified: Secondary | ICD-10-CM | POA: Diagnosis not present

## 2023-05-03 DIAGNOSIS — Z0000 Encounter for general adult medical examination without abnormal findings: Secondary | ICD-10-CM | POA: Diagnosis not present

## 2023-05-03 DIAGNOSIS — I10 Essential (primary) hypertension: Secondary | ICD-10-CM | POA: Diagnosis not present

## 2023-05-03 DIAGNOSIS — Z1331 Encounter for screening for depression: Secondary | ICD-10-CM | POA: Diagnosis not present

## 2023-05-03 DIAGNOSIS — G039 Meningitis, unspecified: Secondary | ICD-10-CM | POA: Diagnosis not present

## 2023-05-03 DIAGNOSIS — K219 Gastro-esophageal reflux disease without esophagitis: Secondary | ICD-10-CM | POA: Diagnosis not present

## 2023-05-03 DIAGNOSIS — M810 Age-related osteoporosis without current pathological fracture: Secondary | ICD-10-CM | POA: Diagnosis not present

## 2023-05-03 DIAGNOSIS — E039 Hypothyroidism, unspecified: Secondary | ICD-10-CM | POA: Diagnosis not present

## 2023-05-03 MED ORDER — LISINOPRIL 20 MG PO TABS
20.0000 mg | ORAL_TABLET | Freq: Every day | ORAL | 1 refills | Status: AC
  Filled 2023-05-03 – 2023-07-19 (×2): qty 90, 90d supply, fill #0
  Filled 2023-11-02: qty 90, 90d supply, fill #1

## 2023-05-03 MED ORDER — ATORVASTATIN CALCIUM 10 MG PO TABS
10.0000 mg | ORAL_TABLET | Freq: Every evening | ORAL | 3 refills | Status: AC
  Filled 2023-05-03 – 2023-07-19 (×2): qty 90, 90d supply, fill #0
  Filled 2023-11-02: qty 90, 90d supply, fill #1
  Filled 2024-02-06: qty 90, 90d supply, fill #2

## 2023-05-03 MED ORDER — LEVOTHYROXINE SODIUM 75 MCG PO TABS
75.0000 ug | ORAL_TABLET | Freq: Every morning | ORAL | 3 refills | Status: AC
  Filled 2023-05-03 – 2023-07-19 (×2): qty 90, 90d supply, fill #0
  Filled 2023-11-02: qty 90, 90d supply, fill #1
  Filled 2024-02-15: qty 90, 90d supply, fill #2

## 2023-05-03 MED ORDER — AMLODIPINE BESYLATE 2.5 MG PO TABS
2.5000 mg | ORAL_TABLET | Freq: Every day | ORAL | 1 refills | Status: AC
  Filled 2023-05-03 – 2023-11-02 (×2): qty 90, 90d supply, fill #0

## 2023-06-21 ENCOUNTER — Other Ambulatory Visit: Payer: Self-pay

## 2023-06-21 ENCOUNTER — Other Ambulatory Visit (HOSPITAL_COMMUNITY): Payer: Self-pay

## 2023-06-22 ENCOUNTER — Other Ambulatory Visit: Payer: Self-pay

## 2023-06-27 DIAGNOSIS — R944 Abnormal results of kidney function studies: Secondary | ICD-10-CM | POA: Diagnosis not present

## 2023-07-19 ENCOUNTER — Other Ambulatory Visit (HOSPITAL_COMMUNITY): Payer: Self-pay

## 2023-07-19 ENCOUNTER — Other Ambulatory Visit: Payer: Self-pay

## 2023-11-02 ENCOUNTER — Other Ambulatory Visit: Payer: Self-pay

## 2023-11-02 ENCOUNTER — Other Ambulatory Visit (HOSPITAL_COMMUNITY): Payer: Self-pay

## 2023-11-08 DIAGNOSIS — M1990 Unspecified osteoarthritis, unspecified site: Secondary | ICD-10-CM | POA: Diagnosis not present

## 2023-11-08 DIAGNOSIS — M810 Age-related osteoporosis without current pathological fracture: Secondary | ICD-10-CM | POA: Diagnosis not present

## 2023-11-08 DIAGNOSIS — Z23 Encounter for immunization: Secondary | ICD-10-CM | POA: Diagnosis not present

## 2023-11-08 DIAGNOSIS — E039 Hypothyroidism, unspecified: Secondary | ICD-10-CM | POA: Diagnosis not present

## 2023-11-08 DIAGNOSIS — E7800 Pure hypercholesterolemia, unspecified: Secondary | ICD-10-CM | POA: Diagnosis not present

## 2023-11-08 DIAGNOSIS — I10 Essential (primary) hypertension: Secondary | ICD-10-CM | POA: Diagnosis not present

## 2024-01-16 ENCOUNTER — Other Ambulatory Visit: Payer: Self-pay

## 2024-01-16 ENCOUNTER — Other Ambulatory Visit (HOSPITAL_COMMUNITY): Payer: Self-pay

## 2024-01-16 MED ORDER — AMLODIPINE BESYLATE 10 MG PO TABS
10.0000 mg | ORAL_TABLET | Freq: Every day | ORAL | 1 refills | Status: AC
  Filled 2024-01-16: qty 90, 90d supply, fill #0

## 2024-02-06 ENCOUNTER — Other Ambulatory Visit (HOSPITAL_COMMUNITY): Payer: Self-pay

## 2024-02-08 ENCOUNTER — Other Ambulatory Visit (HOSPITAL_COMMUNITY): Payer: Self-pay

## 2024-02-14 ENCOUNTER — Other Ambulatory Visit (HOSPITAL_COMMUNITY): Payer: Self-pay

## 2024-02-14 MED ORDER — LISINOPRIL 20 MG PO TABS
20.0000 mg | ORAL_TABLET | Freq: Every day | ORAL | 0 refills | Status: AC
  Filled 2024-02-14: qty 90, 90d supply, fill #0

## 2024-02-15 ENCOUNTER — Other Ambulatory Visit (HOSPITAL_COMMUNITY): Payer: Self-pay

## 2024-02-15 ENCOUNTER — Other Ambulatory Visit: Payer: Self-pay

## 2024-02-17 ENCOUNTER — Other Ambulatory Visit (HOSPITAL_COMMUNITY): Payer: Self-pay

## 2024-02-17 MED ORDER — LEVOTHYROXINE SODIUM 75 MCG PO TABS
75.0000 ug | ORAL_TABLET | Freq: Every morning | ORAL | 3 refills | Status: AC
  Filled 2024-02-17: qty 90, 90d supply, fill #0

## 2024-02-29 ENCOUNTER — Other Ambulatory Visit (HOSPITAL_BASED_OUTPATIENT_CLINIC_OR_DEPARTMENT_OTHER): Payer: Self-pay

## 2024-02-29 ENCOUNTER — Other Ambulatory Visit (HOSPITAL_COMMUNITY): Payer: Self-pay

## 2024-02-29 ENCOUNTER — Other Ambulatory Visit: Payer: Self-pay

## 2024-02-29 MED ORDER — MELATONIN 5 MG PO TABS
5.0000 mg | ORAL_TABLET | Freq: Every day | ORAL | 3 refills | Status: AC
  Filled 2024-02-29: qty 30, 30d supply, fill #0

## 2024-03-05 ENCOUNTER — Other Ambulatory Visit: Payer: Self-pay

## 2024-03-09 ENCOUNTER — Other Ambulatory Visit (HOSPITAL_COMMUNITY): Payer: Self-pay

## 2024-03-09 MED ORDER — VITAMIN D (ERGOCALCIFEROL) 50000 UNITS PO CAPS
50000.0000 [IU] | ORAL_CAPSULE | ORAL | 0 refills | Status: AC
  Filled 2024-03-09 – 2024-03-15 (×2): qty 8, 56d supply, fill #0

## 2024-03-09 MED ORDER — LEVOTHYROXINE SODIUM 112 MCG PO TABS
112.0000 ug | ORAL_TABLET | Freq: Every day | ORAL | 4 refills | Status: AC
  Filled 2024-03-09 – 2024-03-15 (×2): qty 30, 30d supply, fill #0

## 2024-03-11 ENCOUNTER — Other Ambulatory Visit (HOSPITAL_COMMUNITY): Payer: Self-pay

## 2024-03-14 ENCOUNTER — Other Ambulatory Visit: Payer: Self-pay

## 2024-03-15 ENCOUNTER — Other Ambulatory Visit (HOSPITAL_COMMUNITY): Payer: Self-pay

## 2024-03-15 ENCOUNTER — Other Ambulatory Visit: Payer: Self-pay

## 2024-03-16 ENCOUNTER — Other Ambulatory Visit (HOSPITAL_COMMUNITY): Payer: Self-pay

## 2024-03-16 ENCOUNTER — Other Ambulatory Visit: Payer: Self-pay
